# Patient Record
Sex: Male | Born: 1960 | Race: White | Hispanic: No | Marital: Single | Smoking: Never smoker
Health system: Southern US, Community
[De-identification: ages and names within clinical notes are randomized; demographics above are authoritative.]

## PROBLEM LIST (undated history)

## (undated) DIAGNOSIS — K625 Hemorrhage of anus and rectum: Secondary | ICD-10-CM

## (undated) DIAGNOSIS — K623 Rectal prolapse: Secondary | ICD-10-CM

## (undated) DIAGNOSIS — F79 Unspecified intellectual disabilities: Secondary | ICD-10-CM

## (undated) DIAGNOSIS — K649 Unspecified hemorrhoids: Secondary | ICD-10-CM

## (undated) DIAGNOSIS — R011 Cardiac murmur, unspecified: Secondary | ICD-10-CM

## (undated) DIAGNOSIS — H02409 Unspecified ptosis of unspecified eyelid: Secondary | ICD-10-CM

## (undated) DIAGNOSIS — G40909 Epilepsy, unspecified, not intractable, without status epilepticus: Secondary | ICD-10-CM

## (undated) HISTORY — DX: Cardiac murmur, unspecified: R01.1

## (undated) HISTORY — DX: Unspecified intellectual disabilities: F79

## (undated) HISTORY — PX: HERNIA REPAIR: SHX51

## (undated) HISTORY — PX: ORTHOPEDIC SURGERY: SHX850

## (undated) HISTORY — DX: Epilepsy, unspecified, not intractable, without status epilepticus: G40.909

## (undated) HISTORY — DX: Unspecified ptosis of unspecified eyelid: H02.409

## (undated) HISTORY — PX: OTHER SURGICAL HISTORY: SHX169

---

## 1997-09-18 ENCOUNTER — Encounter (HOSPITAL_COMMUNITY): Admission: RE | Admit: 1997-09-18 | Discharge: 1997-10-19 | Payer: Self-pay | Admitting: Family Medicine

## 1997-11-05 ENCOUNTER — Encounter (HOSPITAL_COMMUNITY): Admission: RE | Admit: 1997-11-05 | Discharge: 1998-02-03 | Payer: Self-pay | Admitting: Family Medicine

## 1999-11-24 ENCOUNTER — Inpatient Hospital Stay (HOSPITAL_COMMUNITY): Admission: EM | Admit: 1999-11-24 | Discharge: 1999-11-25 | Payer: Self-pay

## 1999-11-24 ENCOUNTER — Encounter: Payer: Self-pay | Admitting: Orthopedic Surgery

## 1999-12-07 ENCOUNTER — Ambulatory Visit (HOSPITAL_BASED_OUTPATIENT_CLINIC_OR_DEPARTMENT_OTHER): Admission: RE | Admit: 1999-12-07 | Discharge: 1999-12-08 | Payer: Self-pay | Admitting: Orthopedic Surgery

## 1999-12-07 ENCOUNTER — Encounter: Payer: Self-pay | Admitting: Orthopedic Surgery

## 2001-09-30 ENCOUNTER — Emergency Department (HOSPITAL_COMMUNITY): Admission: EM | Admit: 2001-09-30 | Discharge: 2001-09-30 | Payer: Self-pay | Admitting: Emergency Medicine

## 2010-09-30 ENCOUNTER — Encounter: Payer: Self-pay | Admitting: Cardiology

## 2010-10-03 ENCOUNTER — Encounter: Payer: Self-pay | Admitting: Cardiology

## 2010-10-03 ENCOUNTER — Ambulatory Visit (INDEPENDENT_AMBULATORY_CARE_PROVIDER_SITE_OTHER): Payer: Medicare Other | Admitting: Cardiology

## 2010-10-03 VITALS — BP 118/70 | HR 73 | Ht 60.0 in | Wt 108.8 lb

## 2010-10-03 DIAGNOSIS — G40909 Epilepsy, unspecified, not intractable, without status epilepticus: Secondary | ICD-10-CM | POA: Insufficient documentation

## 2010-10-03 DIAGNOSIS — I421 Obstructive hypertrophic cardiomyopathy: Secondary | ICD-10-CM

## 2010-10-03 DIAGNOSIS — I422 Other hypertrophic cardiomyopathy: Secondary | ICD-10-CM

## 2010-10-03 DIAGNOSIS — F79 Unspecified intellectual disabilities: Secondary | ICD-10-CM

## 2010-10-03 NOTE — Progress Notes (Signed)
   Derek Chandler Date of Birth: 09-13-1960   History of Present Illness: Derek Chandler is seen at the request of his primary care physician for evaluation of his murmur and whether or not he needs SBE prophylaxis. He is 50 years old. He was seen here last in 2004. He has a history of hypertrophic obstructive cardiomyopathy based on echocardiogram in 1995. He has never had any symptoms related to his cardiac condition. Previously he had been on routine SBE prophylaxis but has never had an episode of endocarditis. He denies any shortness of breath, palpitations, dizziness, or chest pain. He has never had syncope.  Current Outpatient Prescriptions on File Prior to Visit  Medication Sig Dispense Refill  . aspirin 81 MG tablet Take 81 mg by mouth daily.        . Calcium Carbonate-Vitamin D (CALCIUM 500 + D PO) Take by mouth 2 (two) times daily.        . carbamazepine (TEGRETOL XR) 200 MG 12 hr tablet Take 200 mg by mouth 2 (two) times daily. 2 and 1/2 tablets daily       . docusate sodium (COLACE) 100 MG capsule Take 100 mg by mouth 3 (three) times daily.        . simvastatin (ZOCOR) 10 MG tablet Take 10 mg by mouth at bedtime.          No Known Allergies  Past Medical History  Diagnosis Date  . Heart murmur   . Mental retardation   . Ptosis   . Seizure disorder   . Hypertrophic obstructive cardiomyopathy     Past Surgical History  Procedure Date  . Orthopedic surgery     History  Smoking status  . Never Smoker   Smokeless tobacco  . Not on file    History  Alcohol Use No    History reviewed. No pertinent family history.  Review of Systems: The review of systems is primarily given by his caregiver.  All other systems were reviewed and are negative.  Physical Exam: BP 118/70  Pulse 73  Ht 5' (1.524 m)  Wt 108 lb 12.8 oz (49.351 kg)  BMI 21.25 kg/m2 He is a thin mentally retarded male seen in a chair. Vocal responses are limited. His HEENT exam is remarkable for ptosis.pupils  are equal round and reactive. Oropharynx is clear. Neck is supple without JVD, adenopathy, thyromegaly, or bruits. Lungs are clear. Cardiac exam reveals a grade 2/6 systolic ejection murmur heard best at the left sternal border. There is no diastolic murmur or S3. There is no left. Abdomen is soft and nontender. He has no edema. LABORATORY DATA: ECG demonstrates normal sinus rhythm with an incomplete right bundle branch block. There is right ventricular hypertrophy with strain. There are Q waves in the inferior leads.  Assessment / Plan:

## 2010-10-03 NOTE — Assessment & Plan Note (Signed)
The patient has known hypertrophic obstructive cardiomyopathy. He is asymptomatic. Based on current AHA guidelines he does not require SBE prophylaxis. There is also no indication for any other cardiac medications at this time. We will plan on seeing him back on a p.r.n. basis.

## 2010-10-03 NOTE — Patient Instructions (Signed)
No SBE prohylaxis needed.  We will see back PRN

## 2010-10-04 ENCOUNTER — Telehealth: Payer: Self-pay | Admitting: *Deleted

## 2010-10-04 NOTE — Telephone Encounter (Signed)
Called [facility] center to get fax number-to fax office note: [phone]

## 2010-10-05 ENCOUNTER — Encounter: Payer: Self-pay | Admitting: Cardiology

## 2011-02-26 ENCOUNTER — Encounter (HOSPITAL_COMMUNITY): Payer: Self-pay | Admitting: Emergency Medicine

## 2011-02-26 ENCOUNTER — Emergency Department (HOSPITAL_COMMUNITY)
Admission: EM | Admit: 2011-02-26 | Discharge: 2011-02-27 | Disposition: A | Discharge status: Home / Self Care | Payer: Medicare Other | Attending: Emergency Medicine | Admitting: Emergency Medicine

## 2011-02-26 ENCOUNTER — Emergency Department (HOSPITAL_COMMUNITY): Payer: Medicare Other

## 2011-02-26 DIAGNOSIS — W2209XA Striking against other stationary object, initial encounter: Secondary | ICD-10-CM | POA: Insufficient documentation

## 2011-02-26 DIAGNOSIS — F79 Unspecified intellectual disabilities: Secondary | ICD-10-CM | POA: Insufficient documentation

## 2011-02-26 DIAGNOSIS — M79609 Pain in unspecified limb: Secondary | ICD-10-CM | POA: Insufficient documentation

## 2011-02-26 DIAGNOSIS — S62639A Displaced fracture of distal phalanx of unspecified finger, initial encounter for closed fracture: Secondary | ICD-10-CM | POA: Insufficient documentation

## 2011-02-26 DIAGNOSIS — Z79899 Other long term (current) drug therapy: Secondary | ICD-10-CM | POA: Insufficient documentation

## 2011-02-26 DIAGNOSIS — G40909 Epilepsy, unspecified, not intractable, without status epilepticus: Secondary | ICD-10-CM | POA: Insufficient documentation

## 2011-02-26 NOTE — ED Notes (Signed)
Pt presents with caregiver from [facility], pt MRDD, recently went on home visit, presents with band aid on right middle finger, unknown cause, dsd in place, pms intact

## 2011-02-27 MED ORDER — CEPHALEXIN 500 MG PO CAPS: 500.0000 mg | ORAL_CAPSULE | Freq: Four times a day (QID) | ORAL | Status: AC

## 2011-02-27 MED ORDER — HYDROCODONE-ACETAMINOPHEN 5-500 MG PO TABS: 2.0000 | ORAL_TABLET | Freq: Four times a day (QID) | ORAL | Status: AC | PRN

## 2011-02-27 MED ORDER — CEPHALEXIN 500 MG PO CAPS
ORAL_CAPSULE | ORAL | Status: AC
  Filled 2011-02-27: qty 1

## 2011-02-27 MED ORDER — CEPHALEXIN 500 MG PO CAPS
500.0000 mg | ORAL_CAPSULE | Freq: Once | ORAL | Status: AC
  Administered 2011-02-27: 500 mg via ORAL

## 2011-02-27 NOTE — ED Provider Notes (Signed)
History    patient with history of severe mental retardation is present to the ED with his caregiver. Protective, the patient went home with his brother today when apparently he jammed his right middle finger in a door. He returns to his living facility with a Band-Aids to right middle finger and some mild bleeding. The patient does not complain of any significant discomfort. However patient unable to further verbalize due to his mental retardation.    CSN: [id number] Arrival date & time: 02/26/2011 11:04 PM   First MD Initiated Contact with Patient 02/27/11 0200      Chief Complaint  Patient presents with  . Finger Injury    Right Middle Nail    (Consider location/radiation/quality/duration/timing/severity/associated sxs/prior treatment) Patient is a 50 y.o. male presenting with hand pain. The history is provided by the nursing home and a caregiver. The history is limited by a developmental delay.  Hand Pain This is a new problem. The current episode started today. The problem occurs constantly. Pertinent negatives include no numbness. The symptoms are aggravated by bending.    Past Medical History  Diagnosis Date  . Heart murmur   . Mental retardation   . Ptosis   . Seizure disorder   . Hypertrophic obstructive cardiomyopathy     Past Surgical History  Procedure Date  . Orthopedic surgery     History reviewed. No pertinent family history.  History  Substance Use Topics  . Smoking status: Never Smoker   . Smokeless tobacco: Not on file  . Alcohol Use: No      Review of Systems  Neurological: Negative for numbness.  All other systems reviewed and are negative.    Allergies  Review of patient's allergies indicates no known allergies.  Home Medications   Current Outpatient Rx  Name Route Sig Dispense Refill  . ASPIRIN 81 MG PO TABS Oral Take 81 mg by mouth daily.      Marland Kitchen CALCIUM 500 + D PO Oral Take by mouth 2 (two) times daily.      Marland Kitchen CARBAMAZEPINE 200 MG PO  TB12 Oral Take 200 mg by mouth 2 (two) times daily. 2 and 1/2 tablets daily     . DOCUSATE SODIUM 100 MG PO CAPS Oral Take 100 mg by mouth 3 (three) times daily.      Marland Kitchen SIMVASTATIN 10 MG PO TABS Oral Take 10 mg by mouth at bedtime.      Marland Kitchen CARBAMAZEPINE 400 MG PO TB12 Oral Take 400 mg by mouth 2 (two) times daily.        BP 132/75  Pulse 86  Temp(Src) 98 F (36.7 C) (Oral)  Resp 16  SpO2 96%  Physical Exam  Constitutional: He appears well-developed and well-nourished.  Musculoskeletal:       R hand: R middle finger with blood to fingernail, but no open wound.  Tenderness to palpation.  Unable to flex due to pain.    Neurological: He is alert.  Skin: Skin is warm and dry.    ED Course  Procedures (including critical care time)  Labs Reviewed - No data to display Dg Finger Middle Right  02/26/2011  *RADIOLOGY REPORT*  Clinical Data: Injury  RIGHT MIDDLE FINGER 2+V  Comparison: None.  Findings: There is a mildly displaced fracture involving the proximal metaphysis of the distal phalanx of the long finger. There is associated soft tissue swelling.  Bones are mildly demineralized.  IMPRESSION: Acute fracture involving the distal phalanx of the long finger.  Original  Report Authenticated By: Donavan Burnet, M.D.     No diagnosis found.    MDM  X-ray of right middle finger basic is significant for a mildly displaced fracture involving the proximal metaphysis of the distal phalanx.  On exam, the finger is significant for trauma to nail bed with mild bleeding.  No obvious laceration or bone exposure Decreased range of motion due to pain. Normal wrists evaluation.  Finger will be seen with appropriate wound care. A finger splint will be placed.  Antibiotic will be given here and at discharge. I would give him referral to hand specialist.        Fayrene Helper, PA Resident 02/27/11 [phone]

## 2011-02-27 NOTE — ED Provider Notes (Signed)
Medical screening examination/treatment/procedure(s) were performed by non-physician practitioner and as supervising physician I was immediately available for consultation/collaboration.   Vida Roller, MD 02/27/11 2037

## 2012-09-11 ENCOUNTER — Emergency Department (HOSPITAL_COMMUNITY): Payer: Medicare Other

## 2012-09-11 ENCOUNTER — Encounter (HOSPITAL_COMMUNITY): Payer: Self-pay | Admitting: Emergency Medicine

## 2012-09-11 ENCOUNTER — Emergency Department (HOSPITAL_COMMUNITY)
Admission: EM | Admit: 2012-09-11 | Discharge: 2012-09-11 | Disposition: A | Discharge status: Home / Self Care | Payer: Medicare Other | Source: Home / Self Care | Attending: Emergency Medicine | Admitting: Emergency Medicine

## 2012-09-11 DIAGNOSIS — R569 Unspecified convulsions: Secondary | ICD-10-CM

## 2012-09-11 DIAGNOSIS — H02409 Unspecified ptosis of unspecified eyelid: Secondary | ICD-10-CM | POA: Insufficient documentation

## 2012-09-11 DIAGNOSIS — IMO0002 Reserved for concepts with insufficient information to code with codable children: Secondary | ICD-10-CM | POA: Insufficient documentation

## 2012-09-11 DIAGNOSIS — Y9389 Activity, other specified: Secondary | ICD-10-CM | POA: Insufficient documentation

## 2012-09-11 DIAGNOSIS — Z8679 Personal history of other diseases of the circulatory system: Secondary | ICD-10-CM | POA: Insufficient documentation

## 2012-09-11 DIAGNOSIS — Z7982 Long term (current) use of aspirin: Secondary | ICD-10-CM | POA: Insufficient documentation

## 2012-09-11 DIAGNOSIS — Z79899 Other long term (current) drug therapy: Secondary | ICD-10-CM | POA: Insufficient documentation

## 2012-09-11 DIAGNOSIS — R4182 Altered mental status, unspecified: Secondary | ICD-10-CM | POA: Insufficient documentation

## 2012-09-11 DIAGNOSIS — Y9210 Unspecified residential institution as the place of occurrence of the external cause: Secondary | ICD-10-CM | POA: Insufficient documentation

## 2012-09-11 DIAGNOSIS — W010XXA Fall on same level from slipping, tripping and stumbling without subsequent striking against object, initial encounter: Secondary | ICD-10-CM | POA: Insufficient documentation

## 2012-09-11 DIAGNOSIS — M6281 Muscle weakness (generalized): Secondary | ICD-10-CM | POA: Insufficient documentation

## 2012-09-11 DIAGNOSIS — F79 Unspecified intellectual disabilities: Secondary | ICD-10-CM | POA: Insufficient documentation

## 2012-09-11 DIAGNOSIS — G40909 Epilepsy, unspecified, not intractable, without status epilepticus: Secondary | ICD-10-CM | POA: Insufficient documentation

## 2012-09-11 LAB — URINALYSIS, ROUTINE W REFLEX MICROSCOPIC
Leukocytes, UA: NEGATIVE
Nitrite: NEGATIVE
Protein, ur: NEGATIVE mg/dL
Urobilinogen, UA: 0.2 mg/dL (ref 0.0–1.0)

## 2012-09-11 LAB — CBC WITH DIFFERENTIAL/PLATELET
Basophils Relative: 0 % (ref 0–1)
HCT: 42.4 % (ref 39.0–52.0)
Hemoglobin: 14.8 g/dL (ref 13.0–17.0)
MCH: 31.2 pg (ref 26.0–34.0)
MCHC: 34.9 g/dL (ref 30.0–36.0)
Monocytes Absolute: 1.9 10*3/uL — ABNORMAL HIGH (ref 0.1–1.0)
Monocytes Relative: 15 % — ABNORMAL HIGH (ref 3–12)
Neutro Abs: 9.7 10*3/uL — ABNORMAL HIGH (ref 1.7–7.7)

## 2012-09-11 LAB — COMPREHENSIVE METABOLIC PANEL
Albumin: 4.3 g/dL (ref 3.5–5.2)
BUN: 17 mg/dL (ref 6–23)
Chloride: 103 mEq/L (ref 96–112)
Creatinine, Ser: 0.51 mg/dL (ref 0.50–1.35)
GFR calc Af Amer: >90 mL/min (ref >90)
GFR calc non Af Amer: >90 mL/min (ref >90)
Glucose, Bld: 94 mg/dL (ref 70–99)
Total Bilirubin: 0.3 mg/dL (ref 0.3–1.2)

## 2012-09-11 LAB — CARBAMAZEPINE LEVEL, TOTAL: Carbamazepine Lvl: 13.8 ug/mL — ABNORMAL HIGH (ref 4.0–12.0)

## 2012-09-11 MED ORDER — LORAZEPAM 2 MG/ML IJ SOLN
0.5000 mg | Freq: Once | INTRAMUSCULAR | Status: AC
  Administered 2012-09-11: 0.5 mg via INTRAVENOUS
  Filled 2012-09-11: qty 1

## 2012-09-11 MED ORDER — SODIUM CHLORIDE 0.9 % IV BOLUS (SEPSIS)
1000.0000 mL | Freq: Once | INTRAVENOUS | Status: AC
  Administered 2012-09-11: 1000 mL via INTRAVENOUS

## 2012-09-11 NOTE — ED Notes (Signed)
Pt caregiver reports pt behavior was not normal this morning.  Caregiver reports restlessness and  left arm weakness.  Pt is developmentally delayed.  Pt is unable to communicate at this time.  Pt is alert.

## 2012-09-11 NOTE — ED Provider Notes (Addendum)
History     CSN: [id number]  Arrival date & time 09/11/12  1216   First MD Initiated Contact with Patient 09/11/12 1224      Chief Complaint  Patient presents with  . Head Injury    (Consider location/radiation/quality/duration/timing/severity/associated sxs/prior treatment) HPI Comments: No witnessed sz activity today  Patient is a 52 y.o. male presenting with head injury. The history is provided by medical records (transporter from the home where he lives).  Head Injury Location:  L parietal Time since incident: unknown. Mechanism of injury comment:  Pt not acting himself today and initially refusing to use his left arm and leg but then started using it.  More fidgety then normal. when facility physician examined pt noted abrasion to the left parietal. Chronicity:  New Relieved by:  Nothing Worsened by:  Nothing tried Ineffective treatments:  None tried Associated symptoms: focal weakness   Associated symptoms: no nausea and no vomiting   Associated symptoms comment:  States initially was not moving left side and then just refused to walk but moving the left side.  Unknown if pt fell.  Has not been himself today.  No recent med changes Risk factors comment:  MR, sz disorder.   Past Medical History  Diagnosis Date  . Heart murmur   . Mental retardation   . Ptosis   . Seizure disorder   . Hypertrophic obstructive cardiomyopathy     Past Surgical History  Procedure Laterality Date  . Orthopedic surgery      History reviewed. No pertinent family history.  History  Substance Use Topics  . Smoking status: Never Smoker   . Smokeless tobacco: Not on file  . Alcohol Use: No      Review of Systems  Constitutional: Negative for fever.  Respiratory: Negative for cough and shortness of breath.   Gastrointestinal: Negative for nausea, vomiting and abdominal pain.  Neurological: Positive for focal weakness.  All other systems reviewed and are  negative.    Allergies  Review of patient's allergies indicates no known allergies.  Home Medications   Current Outpatient Rx  Name  Route  Sig  Dispense  Refill  . aspirin 81 MG tablet   Oral   Take 81 mg by mouth daily.           . Calcium Carbonate-Vitamin D (CALCIUM 500 + D PO)   Oral   Take by mouth 2 (two) times daily.           . carbamazepine (TEGRETOL XR) 200 MG 12 hr tablet   Oral   Take 200 mg by mouth 2 (two) times daily. 2 and 1/2 tablets daily          . carbamazepine (TEGRETOL XR) 400 MG 12 hr tablet   Oral   Take 400 mg by mouth 2 (two) times daily.           Marland Kitchen docusate sodium (COLACE) 100 MG capsule   Oral   Take 100 mg by mouth 3 (three) times daily.           . simvastatin (ZOCOR) 10 MG tablet   Oral   Take 10 mg by mouth at bedtime.             BP 125/61  Pulse 95  Temp(Src) 97.7 F (36.5 C) (Axillary)  Resp 20  SpO2 97%  Physical Exam  Nursing note and vitals reviewed. Constitutional: He appears well-developed and well-nourished. No distress.  Walk and looking around  HENT:  Head: Normocephalic. Head is with abrasion.    Mouth/Throat: Oropharynx is clear and moist.  Eyes: Conjunctivae and EOM are normal. Pupils are equal, round, and reactive to light.  strabismus  Neck: Normal range of motion. Neck supple.  Cardiovascular: Normal rate, regular rhythm and intact distal pulses.   Murmur heard. Pulmonary/Chest: Effort normal and breath sounds normal. No respiratory distress. He has no wheezes. He has no rales.  Abdominal: Soft. He exhibits no distension. There is no tenderness. There is no rebound and no guarding.  Musculoskeletal: Normal range of motion. He exhibits no edema and no tenderness.       Right hip: Normal.       Left hip: Normal.  Neurological: He is alert. He has normal strength. No sensory deficit.  Moving all ext.  Skin: Skin is warm and dry. No rash noted. No erythema.  Psychiatric: He has a normal mood  and affect. His behavior is normal.    ED Course  Procedures (including critical care time)  Labs Reviewed  CBC WITH DIFFERENTIAL - Abnormal; Notable for the following:    WBC 12.9 (*)    Neutro Abs 9.7 (*)    Lymphocytes Relative 10 (*)    Monocytes Relative 15 (*)    Monocytes Absolute 1.9 (*)    All other components within normal limits  URINALYSIS, ROUTINE W REFLEX MICROSCOPIC - Abnormal; Notable for the following:    Ketones, ur >80 (*)    All other components within normal limits  COMPREHENSIVE METABOLIC PANEL  CARBAMAZEPINE LEVEL, TOTAL   Ct Head Wo Contrast  09/11/2012   *RADIOLOGY REPORT*  Clinical Data: Change in mental status.  CT HEAD WITHOUT CONTRAST  Technique:  Contiguous axial images were obtained from the base of the skull through the vertex without contrast.  Comparison: None.  Findings: There is slight dilatation of the lateral ventricles.  There is a focal area of encephalomalacia of the posterior aspect  of the right temporal lobe, probably an old infarct. Prominent  sylvian fissure on the right.  Small focal area of encephalomalacia in the left frontal lobe  adjacent to the interhemispheric falx.   No acute hemorrhage or infarction. No acute osseous abnormality.  IMPRESSION:  Slightly dilated lateral ventricles which I suspect are chronic  given the absence of periventricular edema.   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   Original Report Authenticated By: Francene Boyers, M.D.   Ct Cervical Spine Wo Contrast  09/11/2012   **ADDENDUM** CREATED: 09/11/2012 13:45:34  CERVICAL SPINE CT: There is no fracture or subluxation or prevertebral soft tissue swelling.  No prevertebral soft tissue swelling.   Impression:  No acute abnormality of the cervical spine.  **END ADDENDUM** SIGNED BY: Allayne Gitelman. Jena Gauss, M.D.  09/11/2012   *RADIOLOGY REPORT*  Clinical Data: Possible fall.  Mental retardation.  CT CERVICAL SPINE WITHOUT CONTRAST   Technique:  Multidetector CT imaging of the cervical spine was performed. Multiplanar CT image reconstructions were also generated.  Comparison: None.  Findings: There is slight dilatation of the lateral ventricles. There is a focal area of encephalomalacia of the posterior aspect of the right temporal lobe, probably an old infarct.  Prominent sylvian fissure on the right.  Small focal area of encephalomalacia in the left frontal lobe adjacent to the interhemispheric falx.  No acute hemorrhage or infarction.  No acute osseous abnormality.  IMPRESSION: Slightly dilated lateral ventricles which I suspect are chronic given the absence  of periventricular edema.  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   Original Report Authenticated By: Francene Boyers, M.D.     1. Seizure       MDM   Patient with MR, seizure disorder and hypertrophic cardiomyopathy who comes in today due to change in mental status. Today per caregiver patient was noted to not be acting his normal self. He is more fidgety and initially was refusing the use his left arm and leg. After a short time patient started using his extremities but refuses to walk. He normally walks with a walker. The patient evaluated by the facility physician noted to have an abrasion to the left side of his head that staff denies any witnessed seizures and states he hasn't had a seizure in years. He does take Tegretol for this. On exam patient is awake and alert but is nonverbal. He appears in no acute distress and has normal vital signs. Concern for a possible seizure as the cause of his symptoms today versus head trauma with new intracranial findings. No symptoms suggestive of a stroke. Also possibility of urinary tract infection or other infectious etiology. Were supratherapeutic carbamazepine level. CBC, CMP, UA, Tegretol level, head CT pending  2:45 PM  Mild leukocytosis of 13,000 and o/w labs wnl.  UA with >80 ketones.   CT of head and neck without acute findings.  Tegretol level pending.  Feel most likely pt had an unwitnessed sz today and not moving his left side for awhile may have been todd's paralysis.    3:15 PM Tegretol just mildly elevated at 13.8 with normal 12.  Will hold evening dose but o/w will d/c home.   Gwyneth Sprout, MD 09/11/12 1452  Gwyneth Sprout, MD 09/11/12 1515  Gwyneth Sprout, MD 09/11/12 [phone]

## 2012-09-12 ENCOUNTER — Inpatient Hospital Stay (HOSPITAL_COMMUNITY)
Admission: EM | Admit: 2012-09-12 | Discharge: 2012-09-17 | DRG: 064 | Disposition: A | Discharge status: Home / Self Care | Payer: Medicare Other | Attending: Internal Medicine | Admitting: Internal Medicine

## 2012-09-12 ENCOUNTER — Emergency Department (HOSPITAL_COMMUNITY): Payer: Medicare Other

## 2012-09-12 ENCOUNTER — Encounter (HOSPITAL_COMMUNITY): Payer: Self-pay | Admitting: Emergency Medicine

## 2012-09-12 DIAGNOSIS — Z79899 Other long term (current) drug therapy: Secondary | ICD-10-CM

## 2012-09-12 DIAGNOSIS — R531 Weakness: Secondary | ICD-10-CM

## 2012-09-12 DIAGNOSIS — R259 Unspecified abnormal involuntary movements: Secondary | ICD-10-CM | POA: Diagnosis present

## 2012-09-12 DIAGNOSIS — I6350 Cerebral infarction due to unspecified occlusion or stenosis of unspecified cerebral artery: Principal | ICD-10-CM | POA: Diagnosis present

## 2012-09-12 DIAGNOSIS — G9340 Encephalopathy, unspecified: Secondary | ICD-10-CM

## 2012-09-12 DIAGNOSIS — N400 Enlarged prostate without lower urinary tract symptoms: Secondary | ICD-10-CM | POA: Diagnosis present

## 2012-09-12 DIAGNOSIS — I639 Cerebral infarction, unspecified: Secondary | ICD-10-CM

## 2012-09-12 DIAGNOSIS — H02409 Unspecified ptosis of unspecified eyelid: Secondary | ICD-10-CM | POA: Diagnosis present

## 2012-09-12 DIAGNOSIS — Z7982 Long term (current) use of aspirin: Secondary | ICD-10-CM

## 2012-09-12 DIAGNOSIS — I421 Obstructive hypertrophic cardiomyopathy: Secondary | ICD-10-CM | POA: Diagnosis present

## 2012-09-12 DIAGNOSIS — R011 Cardiac murmur, unspecified: Secondary | ICD-10-CM | POA: Diagnosis present

## 2012-09-12 DIAGNOSIS — I4891 Unspecified atrial fibrillation: Secondary | ICD-10-CM | POA: Diagnosis present

## 2012-09-12 DIAGNOSIS — R253 Fasciculation: Secondary | ICD-10-CM

## 2012-09-12 DIAGNOSIS — G40909 Epilepsy, unspecified, not intractable, without status epilepticus: Secondary | ICD-10-CM

## 2012-09-12 DIAGNOSIS — F79 Unspecified intellectual disabilities: Secondary | ICD-10-CM

## 2012-09-12 DIAGNOSIS — E785 Hyperlipidemia, unspecified: Secondary | ICD-10-CM | POA: Diagnosis present

## 2012-09-12 DIAGNOSIS — R350 Frequency of micturition: Secondary | ICD-10-CM | POA: Diagnosis present

## 2012-09-12 DIAGNOSIS — H4920 Sixth [abducent] nerve palsy, unspecified eye: Secondary | ICD-10-CM | POA: Diagnosis present

## 2012-09-12 LAB — CBC WITH DIFFERENTIAL/PLATELET
Basophils Absolute: 0 10*3/uL (ref 0.0–0.1)
Basophils Relative: 1 % (ref 0–1)
Eosinophils Absolute: 0 10*3/uL (ref 0.0–0.7)
Eosinophils Relative: 1 % (ref 0–5)
HCT: 40.1 % (ref 39.0–52.0)
Hemoglobin: 13.9 g/dL (ref 13.0–17.0)
Lymphocytes Relative: 17 % (ref 12–46)
Lymphs Abs: 1.5 10*3/uL (ref 0.7–4.0)
MCH: 31.2 pg (ref 26.0–34.0)
MCHC: 34.7 g/dL (ref 30.0–36.0)
MCV: 89.9 fL (ref 78.0–100.0)
Monocytes Absolute: 1.2 10*3/uL — ABNORMAL HIGH (ref 0.1–1.0)
Monocytes Relative: 14 % — ABNORMAL HIGH (ref 3–12)
Neutro Abs: 5.8 10*3/uL (ref 1.7–7.7)
Neutrophils Relative %: 68 % (ref 43–77)
Platelets: 206 10*3/uL (ref 150–400)
RBC: 4.46 MIL/uL (ref 4.22–5.81)
RDW: 13 % (ref 11.5–15.5)
WBC: 8.5 10*3/uL (ref 4.0–10.5)

## 2012-09-12 LAB — CBC
MCH: 31.4 pg (ref 26.0–34.0)
MCHC: 34.7 g/dL (ref 30.0–36.0)
Platelets: 192 10*3/uL (ref 150–400)
RDW: 13.1 % (ref 11.5–15.5)

## 2012-09-12 LAB — BASIC METABOLIC PANEL
BUN: 12 mg/dL (ref 6–23)
CO2: 24 mEq/L (ref 19–32)
Calcium: 9 mg/dL (ref 8.4–10.5)
Chloride: 104 mEq/L (ref 96–112)
Creatinine, Ser: 0.44 mg/dL — ABNORMAL LOW (ref 0.50–1.35)
GFR calc Af Amer: >90 mL/min (ref >90)
GFR calc non Af Amer: >90 mL/min (ref >90)
Glucose, Bld: 87 mg/dL (ref 70–99)
Potassium: 3.9 mEq/L (ref 3.5–5.1)
Sodium: 139 mEq/L (ref 135–145)

## 2012-09-12 LAB — CARBAMAZEPINE LEVEL, TOTAL: Carbamazepine Lvl: 9.5 ug/mL (ref 4.0–12.0)

## 2012-09-12 LAB — URINALYSIS, ROUTINE W REFLEX MICROSCOPIC
Ketones, ur: >80 mg/dL — AB
Nitrite: NEGATIVE
Urobilinogen, UA: 0.2 mg/dL (ref 0.0–1.0)

## 2012-09-12 LAB — CREATININE, SERUM: Creatinine, Ser: 0.46 mg/dL — ABNORMAL LOW (ref 0.50–1.35)

## 2012-09-12 MED ORDER — SODIUM CHLORIDE 0.9 % IV SOLN: 250.0000 mL | INTRAVENOUS | Status: DC | PRN

## 2012-09-12 MED ORDER — SODIUM CHLORIDE 0.9 % IJ SOLN: 3.0000 mL | INTRAMUSCULAR | Status: DC | PRN

## 2012-09-12 MED ORDER — CARBAMAZEPINE 200 MG PO TABS
400.0000 mg | ORAL_TABLET | ORAL | Status: DC
  Administered 2012-09-13 – 2012-09-17 (×10): 400 mg via ORAL
  Filled 2012-09-12 (×13): qty 2

## 2012-09-12 MED ORDER — FINASTERIDE 5 MG PO TABS
5.0000 mg | ORAL_TABLET | Freq: Every day | ORAL | Status: DC
  Administered 2012-09-13 – 2012-09-17 (×5): 5 mg via ORAL
  Filled 2012-09-12 (×5): qty 1

## 2012-09-12 MED ORDER — ASPIRIN EC 81 MG PO TBEC
81.0000 mg | DELAYED_RELEASE_TABLET | Freq: Every day | ORAL | Status: DC
  Administered 2012-09-12 – 2012-09-15 (×4): 81 mg via ORAL
  Filled 2012-09-12 (×4): qty 1

## 2012-09-12 MED ORDER — LORAZEPAM 2 MG/ML IJ SOLN
2.0000 mg | Freq: Once | INTRAMUSCULAR | Status: AC
  Administered 2012-09-12: 2 mg via INTRAMUSCULAR
  Filled 2012-09-12: qty 1

## 2012-09-12 MED ORDER — SODIUM CHLORIDE 0.9 % IV SOLN
INTRAVENOUS | Status: DC
  Administered 2012-09-12: 21:00:00 via INTRAVENOUS

## 2012-09-12 MED ORDER — CARBAMAZEPINE 200 MG PO TABS
500.0000 mg | ORAL_TABLET | Freq: Every day | ORAL | Status: DC
  Administered 2012-09-12 – 2012-09-16 (×5): 500 mg via ORAL
  Filled 2012-09-12 (×7): qty 2.5

## 2012-09-12 MED ORDER — TAMSULOSIN HCL 0.4 MG PO CAPS
0.4000 mg | ORAL_CAPSULE | Freq: Every day | ORAL | Status: DC
  Administered 2012-09-13 – 2012-09-17 (×5): 0.4 mg via ORAL
  Filled 2012-09-12 (×5): qty 1

## 2012-09-12 MED ORDER — ASPIRIN 81 MG PO TABS: 81.0000 mg | ORAL_TABLET | Freq: Every day | ORAL | Status: DC

## 2012-09-12 MED ORDER — SIMVASTATIN 10 MG PO TABS
10.0000 mg | ORAL_TABLET | Freq: Every day | ORAL | Status: DC
  Administered 2012-09-12 – 2012-09-16 (×5): 10 mg via ORAL
  Filled 2012-09-12 (×6): qty 1

## 2012-09-12 MED ORDER — ENOXAPARIN SODIUM 40 MG/0.4ML ~~LOC~~ SOLN
40.0000 mg | SUBCUTANEOUS | Status: DC
  Administered 2012-09-12 – 2012-09-16 (×5): 40 mg via SUBCUTANEOUS
  Filled 2012-09-12 (×6): qty 0.4

## 2012-09-12 MED ORDER — SODIUM CHLORIDE 0.9 % IJ SOLN
3.0000 mL | Freq: Two times a day (BID) | INTRAMUSCULAR | Status: DC
  Administered 2012-09-13 – 2012-09-16 (×7): 3 mL via INTRAVENOUS

## 2012-09-12 MED ORDER — CARBAMAZEPINE 200 MG PO TABS: 400.0000 mg | ORAL_TABLET | ORAL | Status: DC

## 2012-09-12 MED ORDER — POLYETHYLENE GLYCOL 3350 17 G PO PACK
17.0000 g | PACK | Freq: Every day | ORAL | Status: DC
  Administered 2012-09-13 – 2012-09-17 (×5): 17 g via ORAL
  Filled 2012-09-12 (×5): qty 1

## 2012-09-12 MED ORDER — CARBAMAZEPINE 200 MG PO TABS
200.0000 mg | ORAL_TABLET | Freq: Once | ORAL | Status: AC
  Administered 2012-09-12: 200 mg via ORAL
  Filled 2012-09-12: qty 1

## 2012-09-12 NOTE — ED Notes (Signed)
Per pt re[port from facility, "he is not acting like himself, he is hollering out and figiting" Pt is usually ambulatory with a walker per report. Pt was seen here yesterday and staff from facility states that he is no better.

## 2012-09-12 NOTE — ED Notes (Signed)
[facility] here to transport pt to [facility] ?

## 2012-09-12 NOTE — Consult Note (Signed)
NEURO HOSPITALIST CONSULT NOTE    Reason for Consult: abnormal movements, AMS.  HPI:                                                                                                                                          Derek Chandler is an 52 y.o. male right handed with a past medical history significant for mental retardation, seizures, hypertrophic obstructive cardiomyopathy, transferred to [facility] for further evaluation of declining mental status and abnormal movements. Patient is unable to communicate but his father said that [name] takes tegretol and hasn't had a seizure in more than 20 years. He was evaluated at [facility] ED last night because he was not moving the left side, was less responsive, and had developed abnormal movements involving mainly the lower limbs. CT brain was unremarkable for acute abnormalities and tegretol level was slightly elevated. His symptoms did not improve and was taken back to [facility] ED today. According to his father, the movements " jerkiness" started just few days ago and he can not pinpoint to a rerqson for this new development. No new medications or medical illnesses.   Past Medical History  Diagnosis Date  . Heart murmur   . Mental retardation   . Ptosis   . Seizure disorder   . Hypertrophic obstructive cardiomyopathy     Past Surgical History  Procedure Laterality Date  . Orthopedic surgery    . Right leg surgery with plates and wires    . Hernia repair      History reviewed. No pertinent family history.  Family History:   Social History:  reports that he has never smoked. He has never used smokeless tobacco. He reports that he does not drink alcohol. His drug history is not on file.  Allergies  Allergen Reactions  . Pollen Extract     MEDICATIONS:                                                                                                                     I have reviewed the patient's current  medications.   ROS:   Unable to obtain from the patient due to his mental status.  History obtained from chart review and father.   Physical exam: pleasant male in no apparent distress. Head: normocephalic. Neck: supple, no bruits, no JVD. Cardiac: no murmurs. Lungs: clear. Abdomen: soft, no tender, no mass. Extremities: no edema.  Blood pressure 106/54, pulse 99, temperature 98.6 F (37 C), temperature source Oral, resp. rate 22, weight 48.535 kg (107 lb), SpO2 100.00%.   Neurologic Examination:                                                                                                      Mental Status: Awake but  Unable to communicate. Follows some simple commands. Cranial Nerves: Will defer, as he is very restless and doesn't allow me to examine eyes. Motor: Seems to move all limbs spontaneously and symmetrically.    Sensory:  reacts to noxious stimuli. Deep Tendon Reflexes: hyperreflexia throughout Plantars: Right: downgoing   Left: downgoing Cerebellar: Unable to test. Gait:  No tested. CV: pulses palpable throughout     No results found for this basename: cbc, bmp, coags, chol, tri, ldl, hga1c    Results for orders placed during the hospital encounter of 09/12/12 (from the past 48 hour(s))  BASIC METABOLIC PANEL     Status: Abnormal   Collection Time    09/12/12  1:50 PM      Result Value Range   Sodium 139  135 - 145 mEq/L   Potassium 3.9  3.5 - 5.1 mEq/L   Chloride 104  96 - 112 mEq/L   CO2 24  19 - 32 mEq/L   Glucose, Bld 87  70 - 99 mg/dL   BUN 12  6 - 23 mg/dL   Creatinine, Ser 1.61 (*) 0.50 - 1.35 mg/dL   Calcium 9.0  8.4 - 09.6 mg/dL   GFR calc non Af Amer >90  >90 mL/min   GFR calc Af Amer >90  >90 mL/min   Comment:            The eGFR has been calculated     using the CKD EPI equation.     This calculation has not been     validated in all clinical     situations.      eGFR's persistently     <90 mL/min signify     possible Chronic Kidney Disease.  CBC WITH DIFFERENTIAL     Status: Abnormal   Collection Time    09/12/12  1:50 PM      Result Value Range   WBC 8.5  4.0 - 10.5 K/uL   RBC 4.46  4.22 - 5.81 MIL/uL   Hemoglobin 13.9  13.0 - 17.0 g/dL   HCT 04.5  40.9 - 81.1 %   MCV 89.9  78.0 - 100.0 fL   MCH 31.2  26.0 - 34.0 pg   MCHC 34.7  30.0 - 36.0 g/dL   RDW 91.4  78.2 - 95.6 %   Platelets 206  150 - 400 K/uL   Neutrophils Relative % 68  43 - 77 %   Neutro Abs 5.8  1.7 - 7.7 K/uL  Lymphocytes Relative 17  12 - 46 %   Lymphs Abs 1.5  0.7 - 4.0 K/uL   Monocytes Relative 14 (*) 3 - 12 %   Monocytes Absolute 1.2 (*) 0.1 - 1.0 K/uL   Eosinophils Relative 1  0 - 5 %   Eosinophils Absolute 0.0  0.0 - 0.7 K/uL   Basophils Relative 1  0 - 1 %   Basophils Absolute 0.0  0.0 - 0.1 K/uL  CARBAMAZEPINE LEVEL, TOTAL     Status: None   Collection Time    09/12/12  2:40 PM      Result Value Range   Carbamazepine Lvl 9.5  4.0 - 12.0 ug/mL  URINALYSIS, ROUTINE W REFLEX MICROSCOPIC     Status: Abnormal   Collection Time    09/12/12  5:00 PM      Result Value Range   Color, Urine AMBER (*) YELLOW   Comment: BIOCHEMICALS MAY BE AFFECTED BY COLOR   APPearance CLEAR  CLEAR   Specific Gravity, Urine 1.031 (*) 1.005 - 1.030   pH 5.0  5.0 - 8.0   Glucose, UA NEGATIVE  NEGATIVE mg/dL   Hgb urine dipstick NEGATIVE  NEGATIVE   Bilirubin Urine SMALL (*) NEGATIVE   Ketones, ur >80 (*) NEGATIVE mg/dL   Protein, ur NEGATIVE  NEGATIVE mg/dL   Urobilinogen, UA 0.2  0.0 - 1.0 mg/dL   Nitrite NEGATIVE  NEGATIVE   Leukocytes, UA NEGATIVE  NEGATIVE   Comment: MICROSCOPIC NOT DONE ON URINES WITH NEGATIVE PROTEIN, BLOOD, LEUKOCYTES, NITRITE, OR GLUCOSE <1000 mg/dL.  CBC     Status: None   Collection Time    09/12/12  6:55 PM      Result Value Range   WBC 9.1  4.0 - 10.5 K/uL   RBC 4.36  4.22 - 5.81 MIL/uL   Hemoglobin 13.7  13.0 - 17.0 g/dL   HCT 95.6  21.3 -  08.6 %   MCV 90.6  78.0 - 100.0 fL   MCH 31.4  26.0 - 34.0 pg   MCHC 34.7  30.0 - 36.0 g/dL   RDW 57.8  46.9 - 62.9 %   Platelets 192  150 - 400 K/uL    Ct Head Wo Contrast  09/12/2012   *RADIOLOGY REPORT*  Clinical Data: Altered mental status.  CT HEAD WITHOUT CONTRAST  Technique:  Contiguous axial images were obtained from the base of the skull through the vertex without contrast.  Comparison: 09/11/2012  Findings: Ventricular dilatation is unchanged from previous exam. Focal area of encephalomalacia involving the posterior aspect of the right temporal lobe is unchanged from previous exam.  Stable focal area of encephalomalacia within the left frontal lobe, image 12/series 2.  There is no evidence for acute brain infarct, hemorrhage or mass.  The paranasal sinuses and mastoid air cells are clear.  The skull is intact.  IMPRESSION:  1.  Stable examination. 2.  No change in volume of the lateral ventricles. 3.  Left frontal lobe and right posterior temporal lobe encephalomalacia is unchanged.   Original Report Authenticated By: Signa Kell, M.D.   Ct Head Wo Contrast  09/11/2012   *RADIOLOGY REPORT*  Clinical Data: Change in mental status.  CT HEAD WITHOUT CONTRAST  Technique:  Contiguous axial images were obtained from the base of the skull through the vertex without contrast.  Comparison: None.  Findings: There is slight dilatation of the lateral ventricles.  There is a focal area of encephalomalacia of the posterior aspect  of  the right temporal lobe, probably an old infarct. Prominent  sylvian fissure on the right.  Small focal area of encephalomalacia in the left frontal lobe  adjacent to the interhemispheric falx.   No acute hemorrhage or infarction. No acute osseous abnormality.  IMPRESSION:  Slightly dilated lateral ventricles which I suspect are chronic  given the absence of periventricular edema.   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   Original Report  Authenticated By: Francene Boyers, M.D.   Ct Cervical Spine Wo Contrast  09/11/2012   **ADDENDUM** CREATED: 09/11/2012 13:45:34  CERVICAL SPINE CT: There is no fracture or subluxation or prevertebral soft tissue swelling.  No prevertebral soft tissue swelling.   Impression:  No acute abnormality of the cervical spine.  **END ADDENDUM** SIGNED BY: Allayne Gitelman. Jena Gauss, M.D.  09/11/2012   *RADIOLOGY REPORT*  Clinical Data: Possible fall.  Mental retardation.  CT CERVICAL SPINE WITHOUT CONTRAST  Technique:  Multidetector CT imaging of the cervical spine was performed. Multiplanar CT image reconstructions were also generated.  Comparison: None.  Findings: There is slight dilatation of the lateral ventricles. There is a focal area of encephalomalacia of the posterior aspect of the right temporal lobe, probably an old infarct.  Prominent sylvian fissure on the right.  Small focal area of encephalomalacia in the left frontal lobe adjacent to the interhemispheric falx.  No acute hemorrhage or infarction.  No acute osseous abnormality.  IMPRESSION: Slightly dilated lateral ventricles which I suspect are chronic given the absence of periventricular edema.  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   Original Report Authenticated By: Francene Boyers, M.D.     Assessment/Plan: 52 years old with MR, symptomatic seizures ( seizure free on tegretol for many years), admitted with recent changes in MS and abnormal movements involving mainly lower extremities. Neuro-exam is very limited due to patient's restlessness and his inability to follow commands. Will start work up with MRI brain and EEG. Will follow up.  Wyatt Portela, MD [facility] [phone]  09/12/2012, 7:31 PM

## 2012-09-12 NOTE — ED Notes (Signed)
Report to Delice Bison at [facility]. Pt to be transferred.

## 2012-09-12 NOTE — ED Provider Notes (Signed)
History     CSN: [id number]  Arrival date & time 09/12/12  1233   First MD Initiated Contact with Patient 09/12/12 1304      Chief Complaint  Patient presents with  . Re-evaluation    (Consider location/radiation/quality/duration/timing/severity/associated sxs/prior treatment) HPI Patient presents to the emergency department with continued change in normal status and involuntary movements of his extremities.  Patient was seen in the emergency department for weakness yesterday.  Patient was sent home following a full evaluation.  Patient was seen by the doctor for the facility and felt that he needed further neurological evaluation.  The history is obtained from a caregiver from the facility, who is with the patient. Past Medical History  Diagnosis Date  . Heart murmur   . Mental retardation   . Ptosis   . Seizure disorder   . Hypertrophic obstructive cardiomyopathy     Past Surgical History  Procedure Laterality Date  . Orthopedic surgery      No family history on file.  History  Substance Use Topics  . Smoking status: Never Smoker   . Smokeless tobacco: Not on file  . Alcohol Use: No      Review of Systems Level V caveat applies due to mental retardation Allergies  Pollen extract  Home Medications   Current Outpatient Rx  Name  Route  Sig  Dispense  Refill  . aspirin 81 MG tablet   Oral   Take 81 mg by mouth daily.           . Calcium Carbonate-Vitamin D (CALCIUM 500 + D PO)   Oral   Take by mouth 2 (two) times daily.           . carbamazepine (TEGRETOL) 200 MG tablet   Oral   Take 400-500 mg by mouth See admin instructions. Pt takes 200 mg twice daily and at 8 PM  pt takes  500 mg         . finasteride (PROSCAR) 5 MG tablet   Oral   Take 5 mg by mouth daily.         . polyethylene glycol (MIRALAX / GLYCOLAX) packet   Oral   Take 17 g by mouth daily.         . simvastatin (ZOCOR) 10 MG tablet   Oral   Take 10 mg by mouth at bedtime.            . tamsulosin (FLOMAX) 0.4 MG CAPS   Oral   Take 0.4 mg by mouth daily.           BP 120/76  Pulse 91  Temp(Src) 97.5 F (36.4 C) (Axillary)  Resp 18  Wt 107 lb (48.535 kg)  BMI 20.9 kg/m2  SpO2 100%  Physical Exam  Nursing note and vitals reviewed. Constitutional: He appears well-developed and well-nourished.  HENT:  Head: Normocephalic and atraumatic.  Mouth/Throat: Oropharynx is clear and moist.  Eyes: Pupils are equal, round, and reactive to light.  Neck: Neck supple.  Cardiovascular: Normal rate, regular rhythm and intact distal pulses.  Exam reveals no friction rub.   Murmur heard. Pulmonary/Chest: Effort normal and breath sounds normal. No respiratory distress. He has no wheezes. He has no rales.  Neurological: He is alert.  Skin: Skin is warm and dry.    ED Course  Procedures (including critical care time)  Labs Reviewed  BASIC METABOLIC PANEL - Abnormal; Notable for the following:    Creatinine, Ser 0.44 (*)  All other components within normal limits  CBC WITH DIFFERENTIAL - Abnormal; Notable for the following:    Monocytes Relative 14 (*)    Monocytes Absolute 1.2 (*)    All other components within normal limits  CARBAMAZEPINE LEVEL, TOTAL   Ct Head Wo Contrast  09/12/2012   *RADIOLOGY REPORT*  Clinical Data: Altered mental status.  CT HEAD WITHOUT CONTRAST  Technique:  Contiguous axial images were obtained from the base of the skull through the vertex without contrast.  Comparison: 09/11/2012  Findings: Ventricular dilatation is unchanged from previous exam. Focal area of encephalomalacia involving the posterior aspect of the right temporal lobe is unchanged from previous exam.  Stable focal area of encephalomalacia within the left frontal lobe, image 12/series 2.  There is no evidence for acute brain infarct, hemorrhage or mass.  The paranasal sinuses and mastoid air cells are clear.  The skull is intact.  IMPRESSION:  1.  Stable examination. 2.   No change in volume of the lateral ventricles. 3.  Left frontal lobe and right posterior temporal lobe encephalomalacia is unchanged.   Original Report Authenticated By: Signa Kell, M.D.   Ct Head Wo Contrast  09/11/2012   *RADIOLOGY REPORT*  Clinical Data: Change in mental status.  CT HEAD WITHOUT CONTRAST  Technique:  Contiguous axial images were obtained from the base of the skull through the vertex without contrast.  Comparison: None.  Findings: There is slight dilatation of the lateral ventricles.  There is a focal area of encephalomalacia of the posterior aspect  of the right temporal lobe, probably an old infarct. Prominent  sylvian fissure on the right.  Small focal area of encephalomalacia in the left frontal lobe  adjacent to the interhemispheric falx.   No acute hemorrhage or infarction. No acute osseous abnormality.  IMPRESSION:  Slightly dilated lateral ventricles which I suspect are chronic  given the absence of periventricular edema.   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   Original Report Authenticated By: Francene Boyers, M.D.   Ct Cervical Spine Wo Contrast  09/11/2012   **ADDENDUM** CREATED: 09/11/2012 13:45:34  CERVICAL SPINE CT: There is no fracture or subluxation or prevertebral soft tissue swelling.  No prevertebral soft tissue swelling.   Impression:  No acute abnormality of the cervical spine.  **END ADDENDUM** SIGNED BY: Allayne Gitelman. Jena Gauss, M.D.  09/11/2012   *RADIOLOGY REPORT*  Clinical Data: Possible fall.  Mental retardation.  CT CERVICAL SPINE WITHOUT CONTRAST  Technique:  Multidetector CT imaging of the cervical spine was performed. Multiplanar CT image reconstructions were also generated.  Comparison: None.  Findings: There is slight dilatation of the lateral ventricles. There is a focal area of encephalomalacia of the posterior aspect of the right temporal lobe, probably an old infarct.  Prominent sylvian fissure on the right.  Small  focal area of encephalomalacia in the left frontal lobe adjacent to the interhemispheric falx.  No acute hemorrhage or infarction.  No acute osseous abnormality.  IMPRESSION: Slightly dilated lateral ventricles which I suspect are chronic given the absence of periventricular edema.  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   Original Report Authenticated By: Francene Boyers, M.D.     1. Weakness   2. Twitching    I spoke with neurology, who felt patient needed admission and further evaluation for these involuntary movements and weakness.   MDM  MDM Reviewed: vitals, nursing note and previous chart Reviewed  previous: labs and CT scan Interpretation: labs and CT scan Consults: admitting MD and neurology            Carlyle Dolly, PA-C 09/12/12 1635  Shared service with midlevel provider. I have personally seen and examined the patient, providing direct face to face care, presenting with the chief complaint of altered mental status. Physical exam findings include restless. Plan will be to get Neuro consultation and possible transfer to [facility] for evaluation. I have reviewed the nursing documentation on past medical history, family history, and social history.   Derwood Kaplan, MD 09/12/12 1646

## 2012-09-12 NOTE — H&P (Signed)
PCP:   No primary provider on file.   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 Patient was given Ativan and was sent home yesterday but as per the caregivers patient has not responded and continues to have involuntary movements. He does have a history of seizures but as per caregivers did not have seizure-like activity. At this time patient will be admitted for further neurologic evaluation. Neurologist was called by the ED physician and they recommended to transfer the patient to [facility] for further evaluation. Patient is unable to provide any history due to his baseline mental retardation.as per caregivers patient has been not sleeping well over the past 2 weeks due to increased frequency of urination. Patient was seen by urologist and was diagnosed with BPH in February and was started on finasteride and tamsulosin.incidentally UA done in the hospital on 09/11/2012 was negative. No history of nausea vomiting or diarrhea no fever, no chest pain shortness of breath .   Allergies:   Allergies  Allergen Reactions  . Pollen Extract       Past Medical History  Diagnosis Date  . Heart murmur   . Mental retardation   . Ptosis   . Seizure disorder   . Hypertrophic obstructive cardiomyopathy     Past Surgical History  Procedure Laterality Date  . Orthopedic surgery    . Right leg surgery with plates and wires    . Hernia repair      Prior to Admission medications   Medication Sig Start Date End Date Taking? Authorizing Provider  aspirin 81 MG tablet Take 81 mg by mouth daily.     Yes Historical Provider, MD  Calcium Carbonate-Vitamin D (CALCIUM 500 + D PO) Take  by mouth 2 (two) times daily.     Yes Historical Provider, MD  carbamazepine (TEGRETOL) 200 MG tablet Take 400-500 mg by mouth See admin instructions. Pt takes 200 mg twice daily and at 8 PM  pt takes  500 mg   Yes Historical Provider, MD  finasteride (PROSCAR) 5 MG tablet Take 5 mg by mouth daily.   Yes Historical Provider, MD  polyethylene glycol (MIRALAX / GLYCOLAX) packet Take 17 g by mouth daily.   Yes Historical Provider, MD  simvastatin (ZOCOR) 10 MG tablet Take 10 mg by mouth at bedtime.     Yes Historical Provider, MD  tamsulosin (FLOMAX) 0.4 MG CAPS Take 0.4 mg by mouth daily.   Yes Historical Provider, MD    Social History:  reports that he has never smoked. He has never used smokeless tobacco. He reports that he does not drink alcohol. His drug history is not on file.  History reviewed. No pertinent family history.  Review of Systems:  As in history of present illness   rest of the review of systems unobtainable as patient is nonverbal    Physical Exam: Blood pressure 144/112, pulse 89, temperature 97.8 F (36.6 C), temperature source Axillary, resp. rate 20, weight 48.535 kg (107 lb), SpO2 98.00%. Constitutional:   Patient is appearing mildly agitated, and not very cooperative for exam  Head: Normocephalic and atraumatic Mouth: Mucus membranes moist Eyes: PERRL, EOMI, conjunctivae normal Neck: Supple, No Thyromegaly Cardiovascular: RRR, S1 normal, S2 normal.  Positive systolic murmur at the mitral area Pulmonary/Chest: CTAB, no wheezes, rales, or rhonchi Abdominal: Soft. Non-tender, non-distended, bowel sounds are normal, no masses, organomegaly, or guarding present.  Neurological: Alert, moving all extremities   Extremities : No Cyanosis, Clubbing or Edema   Labs on Admission:  Results for orders placed during the hospital encounter of 09/12/12 (from the past 48 hour(s))  BASIC METABOLIC PANEL     Status: Abnormal   Collection Time    09/12/12  1:50 PM      Result  Value Range   Sodium 139  135 - 145 mEq/L   Potassium 3.9  3.5 - 5.1 mEq/L   Chloride 104  96 - 112 mEq/L   CO2 24  19 - 32 mEq/L   Glucose, Bld 87  70 - 99 mg/dL   BUN 12  6 - 23 mg/dL   Creatinine, Ser 1.47 (*) 0.50 - 1.35 mg/dL   Calcium 9.0  8.4 - 82.9 mg/dL   GFR calc non Af Amer >90  >90 mL/min   GFR calc Af Amer >90  >90 mL/min   Comment:            The eGFR has been calculated     using the CKD EPI equation.     This calculation has not been     validated in all clinical     situations.     eGFR's persistently     <90 mL/min signify     possible Chronic Kidney Disease.  CBC WITH DIFFERENTIAL     Status: Abnormal   Collection Time    09/12/12  1:50 PM      Result Value Range   WBC 8.5  4.0 - 10.5 K/uL   RBC 4.46  4.22 - 5.81 MIL/uL   Hemoglobin 13.9  13.0 - 17.0 g/dL   HCT 56.2  13.0 - 86.5 %   MCV 89.9  78.0 - 100.0 fL   MCH 31.2  26.0 - 34.0 pg   MCHC 34.7  30.0 - 36.0 g/dL   RDW 78.4  69.6 - 29.5 %   Platelets 206  150 - 400 K/uL   Neutrophils Relative % 68  43 - 77 %   Neutro Abs 5.8  1.7 - 7.7 K/uL   Lymphocytes Relative 17  12 - 46 %   Lymphs Abs 1.5  0.7 - 4.0 K/uL   Monocytes Relative 14 (*) 3 - 12 %   Monocytes Absolute 1.2 (*) 0.1 - 1.0 K/uL   Eosinophils Relative 1  0 - 5 %   Eosinophils Absolute 0.0  0.0 - 0.7 K/uL   Basophils Relative 1  0 - 1 %   Basophils Absolute 0.0  0.0 - 0.1 K/uL  CARBAMAZEPINE LEVEL, TOTAL     Status: None   Collection Time    09/12/12  2:40 PM      Result Value Range   Carbamazepine Lvl 9.5  4.0 - 12.0 ug/mL    Radiological Exams on Admission: Ct Head Wo Contrast  09/12/2012   *RADIOLOGY REPORT*  Clinical Data: Altered mental status.  CT HEAD WITHOUT CONTRAST  Technique:  Contiguous axial images were obtained from the base of the skull through the vertex without contrast.  Comparison: 09/11/2012  Findings: Ventricular dilatation is unchanged from previous exam. Focal area of encephalomalacia involving the posterior  aspect of the right temporal lobe is unchanged from previous exam.  Stable focal area of encephalomalacia within the left frontal lobe, image  12/series 2.  There is no evidence for acute brain infarct, hemorrhage or mass.  The paranasal sinuses and mastoid air cells are clear.  The skull is intact.  IMPRESSION:  1.  Stable examination. 2.  No change in volume of the lateral ventricles. 3.  Left frontal lobe and right posterior temporal lobe encephalomalacia is unchanged.   Original Report Authenticated By: Signa Kell, M.D.   Ct Head Wo Contrast  09/11/2012   *RADIOLOGY REPORT*  Clinical Data: Change in mental status.  CT HEAD WITHOUT CONTRAST  Technique:  Contiguous axial images were obtained from the base of the skull through the vertex without contrast.  Comparison: None.  Findings: There is slight dilatation of the lateral ventricles.  There is a focal area of encephalomalacia of the posterior aspect  of the right temporal lobe, probably an old infarct. Prominent  sylvian fissure on the right.  Small focal area of encephalomalacia in the left frontal lobe  adjacent to the interhemispheric falx.   No acute hemorrhage or infarction. No acute osseous abnormality.  IMPRESSION:  Slightly dilated lateral ventricles which I suspect are chronic  given the absence of periventricular edema.   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   Original Report Authenticated By: Francene Boyers, M.D.   Ct Cervical Spine Wo Contrast  09/11/2012   **ADDENDUM** CREATED: 09/11/2012 13:45:34  CERVICAL SPINE CT: There is no fracture or subluxation or prevertebral soft tissue swelling.  No prevertebral soft tissue swelling.   Impression:  No acute abnormality of the cervical spine.  **END ADDENDUM** SIGNED BY: Allayne Gitelman. Jena Gauss, M.D.  09/11/2012   *RADIOLOGY REPORT*  Clinical Data: Possible fall.  Mental retardation.  CT CERVICAL SPINE WITHOUT CONTRAST  Technique:  Multidetector CT imaging of the  cervical spine was performed. Multiplanar CT image reconstructions were also generated.  Comparison: None.  Findings: There is slight dilatation of the lateral ventricles. There is a focal area of encephalomalacia of the posterior aspect of the right temporal lobe, probably an old infarct.  Prominent sylvian fissure on the right.  Small focal area of encephalomalacia in the left frontal lobe adjacent to the interhemispheric falx.  No acute hemorrhage or infarction.  No acute osseous abnormality.  IMPRESSION: Slightly dilated lateral ventricles which I suspect are chronic given the absence of periventricular edema.  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   Original Report Authenticated By: Francene Boyers, M.D.    Assessment/Plan Mental retardation Involuntary movements seizure disorder Benign prostate hyperplasia  Involuntary movements Patient may be having underlying seizures so we'll require neurology evaluation and possible EEG at [facility]. Also patient has been having insomnia over the past 2 weeks which could also present like this. CT scan head done in the ED did not show significant abnormality. May benefit from MRI.  Seizure disorder Patient will be continued on carbamazepine  Benign prostate hyperplasia Patient was seen by urology and was started on tamsulosin and finesteride Will again obtain a UA and urine culture as patient may have underlying subclinical prostatitis, which could present with increased frequency of urination despite being on above medications.  DVT prophylaxis Lovenox  Time Spent on Admission: 55 min  LAMA,GAGAN S [facility] Pager: [phone] 09/12/2012, 4:55 PM

## 2012-09-13 ENCOUNTER — Inpatient Hospital Stay (HOSPITAL_COMMUNITY): Payer: Medicare Other

## 2012-09-13 DIAGNOSIS — G9340 Encephalopathy, unspecified: Secondary | ICD-10-CM

## 2012-09-13 DIAGNOSIS — R569 Unspecified convulsions: Secondary | ICD-10-CM

## 2012-09-13 DIAGNOSIS — R5381 Other malaise: Secondary | ICD-10-CM

## 2012-09-13 DIAGNOSIS — I517 Cardiomegaly: Secondary | ICD-10-CM

## 2012-09-13 LAB — COMPREHENSIVE METABOLIC PANEL
AST: 38 U/L — ABNORMAL HIGH (ref 0–37)
Albumin: 3.4 g/dL — ABNORMAL LOW (ref 3.5–5.2)
CO2: 20 mEq/L (ref 19–32)
Calcium: 8.3 mg/dL — ABNORMAL LOW (ref 8.4–10.5)
Creatinine, Ser: 0.5 mg/dL (ref 0.50–1.35)
GFR calc non Af Amer: >90 mL/min (ref >90)

## 2012-09-13 LAB — CBC
MCH: 30.8 pg (ref 26.0–34.0)
MCHC: 33.7 g/dL (ref 30.0–36.0)
MCV: 91.5 fL (ref 78.0–100.0)
Platelets: 194 10*3/uL (ref 150–400)
RDW: 13.1 % (ref 11.5–15.5)

## 2012-09-13 LAB — URINE CULTURE: Culture: NO GROWTH

## 2012-09-13 MED ORDER — LORAZEPAM 2 MG/ML IJ SOLN
2.0000 mg | Freq: Once | INTRAMUSCULAR | Status: AC
  Administered 2012-09-13: 2 mg via INTRAVENOUS
  Filled 2012-09-13: qty 1

## 2012-09-13 NOTE — Progress Notes (Signed)
I was asked to see Mr. Derek Chandler s/p transfer from [facility]. Pt was evaluated at [facility] ED last night because he was not moving the left side, was less responsive, and had developed abnormal movements involving mainly the lower limbs. CT brain was unremarkable for acute abnormalities and tegretol level was slightly elevated.  His symptoms did not improve and was taken back to [facility] ED today. A family member reported the movements " jerkiness" started just few days ago and he can not pinpoint to a reason for this new development. No new medications or medical illnesses. At bedside pt noted sleeping in no acute distress. VSS. Spoke with his brother Brett Canales) who is at bedside who relates pt has not slept well in > 24 hrs so it is good to see him sleep. Will continue to monitor closely.  Leanne Chang, NP-C [facility] Pager [phone]

## 2012-09-13 NOTE — Progress Notes (Addendum)
EEG completed; per Camilo, 13 min OK as pts Ativan wore off and removed leads.

## 2012-09-13 NOTE — Progress Notes (Signed)
[facility] PROGRESS NOTE  Derek Chandler WUJ:[id number] DOB: 09-Feb-1961 DOA: 09/12/2012 PCP: No primary provider on file.  Assessment/Plan: Acute Encephalopathy -Per family back to baseline. -They report 2 distinct episodes of right leg weakness and transient fixed gaze. -EEG negative for seizure activity. -MRI pending as will need to be reordered with sedation to r/o CVA. -TIA is a possibility. -ECHO/dopplers pending.  Seizure Disorder -No active seizure activity in the hospital. -Continue Tegretol at home dose.  Mental Retardation  DVT Prophylaxis -Lovenox.  Code Status: Full Family Communication: Sister at bedside   Disposition Plan: Back to group home when workup complete (24-48 hours)   Consultants:  Neurology, Dr. Leroy Kennedy   Antibiotics:  None   Subjective: Non-verbal. Per family back to baseline mental status  Objective: Filed Vitals:   09/12/12 2100 09/13/12 0644 09/13/12 1057 09/13/12 1358  BP: 92/73 105/62 112/58 100/44  Pulse: 85 82 76 77  Temp: 98.3 F (36.8 C)  97.5 F (36.4 C) 98.2 F (36.8 C)  TempSrc: Axillary  Axillary Axillary  Resp: 14 16 18 18   Height:      Weight:      SpO2: 97% 100% 100% 100%    Intake/Output Summary (Last 24 hours) at 09/13/12 1630 Last data filed at 09/13/12 1300  Gross per 24 hour  Intake    720 ml  Output      0 ml  Net    720 ml   Filed Weights   09/12/12 1240 09/12/12 1900  Weight: 48.535 kg (107 lb) 52.164 kg (115 lb)    Exam:   General:  Nonverbal, does not follow commands, moving all 4 extremities spontaneously.  Cardiovascular: RRR, no M/R/G   Respiratory: CTA B  Abdomen: S/NT/+BS  Extremities: no C/C/E   Data Reviewed: Basic Metabolic Panel:  Recent Labs Lab 09/11/12 1250 09/12/12 1350 09/12/12 1855 09/13/12 0515  NA 139 139  --  137  K 4.1 3.9  --  3.5  CL 103 104  --  102  CO2 23 24  --  20  GLUCOSE 94 87  --  80  BUN 17 12  --  13  CREATININE 0.51 0.44* 0.46* 0.50   CALCIUM 9.7 9.0  --  8.3*   Liver Function Tests:  Recent Labs Lab 09/11/12 1250 09/13/12 0515  AST 36 38*  ALT 20 22  ALKPHOS 59 48  BILITOT 0.3 0.2*  PROT 7.1 6.0  ALBUMIN 4.3 3.4*   No results found for this basename: LIPASE, AMYLASE,  in the last 168 hours No results found for this basename: AMMONIA,  in the last 168 hours CBC:  Recent Labs Lab 09/11/12 1250 09/12/12 1350 09/12/12 1855 09/13/12 0515  WBC 12.9* 8.5 9.1 7.8  NEUTROABS 9.7* 5.8  --   --   HGB 14.8 13.9 13.7 13.1  HCT 42.4 40.1 39.5 38.9*  MCV 89.5 89.9 90.6 91.5  PLT 232 206 192 194   Cardiac Enzymes: No results found for this basename: CKTOTAL, CKMB, CKMBINDEX, TROPONINI,  in the last 168 hours BNP (last 3 results) No results found for this basename: PROBNP,  in the last 8760 hours CBG: No results found for this basename: GLUCAP,  in the last 168 hours  No results found for this or any previous visit (from the past 240 hour(s)).   Studies: Ct Head Wo Contrast  09/12/2012   *RADIOLOGY REPORT*  Clinical Data: Altered mental status.  CT HEAD WITHOUT CONTRAST  Technique:  Contiguous axial images  were obtained from the base of the skull through the vertex without contrast.  Comparison: 09/11/2012  Findings: Ventricular dilatation is unchanged from previous exam. Focal area of encephalomalacia involving the posterior aspect of the right temporal lobe is unchanged from previous exam.  Stable focal area of encephalomalacia within the left frontal lobe, image 12/series 2.  There is no evidence for acute brain infarct, hemorrhage or mass.  The paranasal sinuses and mastoid air cells are clear.  The skull is intact.  IMPRESSION:  1.  Stable examination. 2.  No change in volume of the lateral ventricles. 3.  Left frontal lobe and right posterior temporal lobe encephalomalacia is unchanged.   Original Report Authenticated By: Signa Kell, M.D.    Scheduled Meds: . aspirin EC  81 mg Oral Daily  . carbamazepine   400 mg Oral Custom   And  . carbamazepine  500 mg Oral QHS  . enoxaparin (LOVENOX) injection  40 mg Subcutaneous Q24H  . finasteride  5 mg Oral Daily  . LORazepam  2 mg Intravenous Once  . polyethylene glycol  17 g Oral Daily  . simvastatin  10 mg Oral QHS  . sodium chloride  3 mL Intravenous Q12H  . tamsulosin  0.4 mg Oral Daily   Continuous Infusions: . sodium chloride 75 mL/hr at 09/12/12 2100    Principal Problem:   Acute encephalopathy Active Problems:   Mental retardation   Seizure disorder    Time spent: 35 minutes.    Derek Chandler  [facility] Pager [phone]  If 7PM-7AM, please contact night-coverage at [email or web address], password [facility] 09/13/2012, 4:30 PM  LOS: 1 day

## 2012-09-13 NOTE — Procedures (Signed)
EEG report.  Brief clinical history:  52 years old male with MR and new onset intermittent, irregular movements lower extremities. 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  Technique: this is a 17 channel routine scalp EEG performed at the bedside with bipolar and monopolar montages arranged in accordance to the international 10/20 system of electrode placement. One channel was dedicated to EKG recording.  Patient very restless and required sedation with IV ativan. No activating procedures.  EEG Description: the best background was characterized by a medium amplitude, posterior dominant 9 Hz rhythm that is well sustained, symmetric and reactive..  No focal or generalized epileptiform discharges noted.  No slowing seen.  EKG showed sinus rhythm.  Impression: this is a normal EEG performed with sedation. Please, be aware that a normal EEG does not exclude the possibility of epilepsy.  Clinical correlation is advised.  Derek Portela, MD

## 2012-09-13 NOTE — Progress Notes (Signed)
MRI was attempted s/p pt receiving 2mg  ativan. Pt still agitated and unable to cooperate for MRI. Pt is a candidate for conscious sedation (radiology versed/fentyl protocol). Please let dept know how we need to proceed with exam.

## 2012-09-13 NOTE — Progress Notes (Signed)
NEURO HOSPITALIST PROGRESS NOTE   SUBJECTIVE:                                                                                                                        Patient is back to his baseline per sister who is in the room.   OBJECTIVE:                                                                                                                           Vital signs in last 24 hours: Temp:  [97.5 F (36.4 C)-98.6 F (37 C)] 98.2 F (36.8 C) (05/23 1358) Pulse Rate:  [76-99] 77 (05/23 1358) Resp:  [14-22] 18 (05/23 1358) BP: (92-144)/(44-112) 100/44 mmHg (05/23 1358) SpO2:  [97 %-100 %] 100 % (05/23 1358) Weight:  [52.164 kg (115 lb)] 52.164 kg (115 lb) (05/22 1900)  Intake/Output from previous day: 05/22 0701 - 05/23 0700 In: 360 [P.O.:360] Out: -  Intake/Output this shift: Total I/O In: 360 [P.O.:360] Out: -  Nutritional status: Cardiac  Past Medical History  Diagnosis Date  . Heart murmur   . Mental retardation   . Ptosis   . Seizure disorder   . Hypertrophic obstructive cardiomyopathy      Neurologic Exam:  Mental Status: Nonverbal, follows no verbal commands but sitting up in bed moving all extremities, he recognizes his sister and will make motions for her to come over.  HE will often hit himself but this is normal per sister.  Cranial Nerves: ZO:[id number] to threat bilaterally, pupils equal, round, reactive to light III,IV, VI: both eyes are esotropic (beaseline per sister) V,VII: face symmetric, VIII: hearing normal bilaterally XI: bilateral shoulder shrug XII: midline tongue extension Motor: Moving all extremities antigravity with good strength.  Sensory: withdraws from pain in all extremities Deep Tendon Reflexes: 2+bilateral UE, 3+ bilateral KJ and AJ--clonus noted at both ankles (his baseline per sister) Plantars: Up going bilaterally    Lab Results: No results found for this basename: cbc, bmp, coags,  chol, tri, ldl, hga1c   Lipid Panel No results found for this basename: CHOL, TRIG, HDL, CHOLHDL, VLDL, LDLCALC,  in the last 72 hours  Studies/Results: Ct Head Wo Contrast  09/12/2012   *RADIOLOGY REPORT*  Clinical Data: Altered mental status.  CT HEAD WITHOUT CONTRAST  Technique:  Contiguous axial images were obtained from the base of the skull through the vertex without contrast.  Comparison: 09/11/2012  Findings: Ventricular dilatation is unchanged from previous exam. Focal area of encephalomalacia involving the posterior aspect of the right temporal lobe is unchanged from previous exam.  Stable focal area of encephalomalacia within the left frontal lobe, image 12/series 2.  There is no evidence for acute brain infarct, hemorrhage or mass.  The paranasal sinuses and mastoid air cells are clear.  The skull is intact.  IMPRESSION:  1.  Stable examination. 2.  No change in volume of the lateral ventricles. 3.  Left frontal lobe and right posterior temporal lobe encephalomalacia is unchanged.   Original Report Authenticated By: Signa Kell, M.D.    MEDICATIONS                                                                                                                        Scheduled: . aspirin EC  81 mg Oral Daily  . carbamazepine  400 mg Oral Custom   And  . carbamazepine  500 mg Oral QHS  . enoxaparin (LOVENOX) injection  40 mg Subcutaneous Q24H  . finasteride  5 mg Oral Daily  . polyethylene glycol  17 g Oral Daily  . simvastatin  10 mg Oral QHS  . sodium chloride  3 mL Intravenous Q12H  . tamsulosin  0.4 mg Oral Daily    ASSESSMENT/PLAN:                                                                                                           52 years old with MR, symptomatic seizures ( seizure free on tegretol for many years), admitted with recent changes in MS and abnormal movements involving mainly lower extremities. EEG showed no epileptiform activity and patient is back to his  baseline per sister.  MRI is pending as he was agitated on prior attempt.   Recommend: 1) current Tegretol regime 2) MRI brain to evaluate for stroke  If MRI is negative, no further recommendations.     Assessment and plan discussed with with attending physician and they are in agreement.    Felicie Morn PA-C [facility] [phone]  09/13/2012, 3:31 PM

## 2012-09-13 NOTE — Progress Notes (Signed)
Utilization Review Completed.   Stclair Szymborski, RN, BSN Nurse Case Manager  [phone]  

## 2012-09-13 NOTE — Progress Notes (Signed)
  Echocardiogram 2D Echocardiogram has been performed.  Georgian Co 09/13/2012, 6:36 PM

## 2012-09-14 ENCOUNTER — Inpatient Hospital Stay (HOSPITAL_COMMUNITY): Payer: Medicare Other

## 2012-09-14 MED ORDER — MIDAZOLAM HCL 2 MG/2ML IJ SOLN
2.0000 mg | Freq: Once | INTRAMUSCULAR | Status: AC
  Administered 2012-09-14: 4 mg via INTRAVENOUS

## 2012-09-14 NOTE — Progress Notes (Signed)
VASCULAR LAB PRELIMINARY  PRELIMINARY  PRELIMINARY  PRELIMINARY  Carotid Dopplers completed.    Preliminary report:  There is no significant ICA stenosis.  Vertebral artery flow is antegrade.  Darrelle Wiberg, RVT 09/14/2012, 4:43 PM

## 2012-09-14 NOTE — Progress Notes (Signed)
Patient received 4 mg versed for MRI procedure tolerated well, report given to charge nurse Sharlyne Pacas RN on unit [facility]North.

## 2012-09-14 NOTE — Progress Notes (Signed)
Clinical Social Work Department BRIEF PSYCHOSOCIAL ASSESSMENT 09/14/2012  Patient:  Derek Chandler, Derek Chandler     Account Number:  [id number]     Admit date:  09/12/2012  Clinical Social Worker:  Candie Chroman  Date/Time:  09/14/2012 12:52 PM  Referred by:  Physician  Date Referred:  09/14/2012 Referred for  Other - See comment   Other Referral:   Pt from group home.   Interview type:  Family Other interview type:    PSYCHOSOCIAL DATA Living Status:  FACILITY Admitted from facility:   Level of care:  Group Home Primary support name:  Timoth Schara Primary support relationship to patient:  CHILD, ADULT Degree of support available:   supportive    CURRENT CONCERNS Current Concerns  Post-Acute Placement   Other Concerns:    SOCIAL WORK ASSESSMENT / PLAN Pt is a 52 yr old gentleman with MR admitted from [facility] . CSW met with pt's sister to assist with d/c planning. Family expects pt to return to group home once medically stable. Unable to confirm d/c plan with group at this time. Will request week day csw to contact [facility] on Monday to confirm d/c plan.   Assessment/plan status:  Psychosocial Support/Ongoing Assessment of Needs Other assessment/ plan:   Information/referral to community resources:   None needed at this time.    PATIENT'S/FAMILY'S RESPONSE TO PLAN OF CARE: Family has been very pleased with the care pt has received at [facility]. They are looking forward to pt returning to group home when stable.   Cori Razor LCSW [phone]

## 2012-09-14 NOTE — Progress Notes (Signed)
[facility] PROGRESS NOTE  Derek Chandler WUX:[id number] DOB: 1960-05-11 DOA: 09/12/2012 PCP: No primary provider on file.  Assessment/Plan: Acute Encephalopathy -Per family back to baseline. -They report 2 distinct episodes of right leg weakness and transient fixed gaze. -EEG negative for seizure activity. -MRI pending as will need to be reordered with sedation to r/o CVA. -TIA is a possibility. -ECHO: There is septal hypertrophy with chordal SAM but no significant outflow tract gradient The cavity size was normal. Systolic function was normal. The estimated ejection fraction was in the range of 60% to 65%. Wall motion was normal; there were no regional wall motion abnormalities.   Seizure Disorder -No active seizure activity in the hospital. -Continue Tegretol at home dose.  Mental Retardation  DVT Prophylaxis -Lovenox.  Code Status: Full Family Communication: Brother Viviann Spare at bedside   Disposition Plan: Brother will take him home with him tomorrow if medically ready. Should be able to go pending MRI results.   Consultants:  Neurology, Dr. Leroy Kennedy   Antibiotics:  None   Subjective: Non-verbal. Per family back to baseline mental status  Objective: Filed Vitals:   09/13/12 1358 09/13/12 1900 09/14/12 0604 09/14/12 1006  BP: 100/44 113/61 121/62 125/63  Pulse: 77 90 86 81  Temp: 98.2 F (36.8 C) 97.2 F (36.2 C) 97.7 F (36.5 C) 97.3 F (36.3 C)  TempSrc: Axillary Axillary Axillary Axillary  Resp: 18 18 18 18   Height:      Weight:      SpO2: 100% 96% 98% 100%    Intake/Output Summary (Last 24 hours) at 09/14/12 1155 Last data filed at 09/13/12 1900  Gross per 24 hour  Intake    760 ml  Output      0 ml  Net    760 ml   Filed Weights   09/12/12 1240 09/12/12 1900  Weight: 48.535 kg (107 lb) 52.164 kg (115 lb)    Exam:   General:  Nonverbal, does not follow commands, moving all 4 extremities spontaneously.  Cardiovascular: RRR, no  M/R/G   Respiratory: CTA B  Abdomen: S/NT/+BS  Extremities: no C/C/E   Data Reviewed: Basic Metabolic Panel:  Recent Labs Lab 09/11/12 1250 09/12/12 1350 09/12/12 1855 09/13/12 0515  NA 139 139  --  137  K 4.1 3.9  --  3.5  CL 103 104  --  102  CO2 23 24  --  20  GLUCOSE 94 87  --  80  BUN 17 12  --  13  CREATININE 0.51 0.44* 0.46* 0.50  CALCIUM 9.7 9.0  --  8.3*   Liver Function Tests:  Recent Labs Lab 09/11/12 1250 09/13/12 0515  AST 36 38*  ALT 20 22  ALKPHOS 59 48  BILITOT 0.3 0.2*  PROT 7.1 6.0  ALBUMIN 4.3 3.4*   No results found for this basename: LIPASE, AMYLASE,  in the last 168 hours No results found for this basename: AMMONIA,  in the last 168 hours CBC:  Recent Labs Lab 09/11/12 1250 09/12/12 1350 09/12/12 1855 09/13/12 0515  WBC 12.9* 8.5 9.1 7.8  NEUTROABS 9.7* 5.8  --   --   HGB 14.8 13.9 13.7 13.1  HCT 42.4 40.1 39.5 38.9*  MCV 89.5 89.9 90.6 91.5  PLT 232 206 192 194   Cardiac Enzymes: No results found for this basename: CKTOTAL, CKMB, CKMBINDEX, TROPONINI,  in the last 168 hours BNP (last 3 results) No results found for this basename: PROBNP,  in the last 8760 hours  CBG: No results found for this basename: GLUCAP,  in the last 168 hours  Recent Results (from the past 240 hour(s))  URINE CULTURE     Status: None   Collection Time    09/12/12  5:00 PM      Result Value Range Status   Specimen Description URINE, CLEAN CATCH   Final   Special Requests NONE   Final   Culture  Setup Time 09/12/2012 22:01   Final   Colony Count NO GROWTH   Final   Culture NO GROWTH   Final   Report Status 09/13/2012 FINAL   Final     Studies: Ct Head Wo Contrast  09/12/2012   *RADIOLOGY REPORT*  Clinical Data: Altered mental status.  CT HEAD WITHOUT CONTRAST  Technique:  Contiguous axial images were obtained from the base of the skull through the vertex without contrast.  Comparison: 09/11/2012  Findings: Ventricular dilatation is unchanged from  previous exam. Focal area of encephalomalacia involving the posterior aspect of the right temporal lobe is unchanged from previous exam.  Stable focal area of encephalomalacia within the left frontal lobe, image 12/series 2.  There is no evidence for acute brain infarct, hemorrhage or mass.  The paranasal sinuses and mastoid air cells are clear.  The skull is intact.  IMPRESSION:  1.  Stable examination. 2.  No change in volume of the lateral ventricles. 3.  Left frontal lobe and right posterior temporal lobe encephalomalacia is unchanged.   Original Report Authenticated By: Signa Kell, M.D.    Scheduled Meds: . aspirin EC  81 mg Oral Daily  . carbamazepine  400 mg Oral Custom   And  . carbamazepine  500 mg Oral QHS  . enoxaparin (LOVENOX) injection  40 mg Subcutaneous Q24H  . finasteride  5 mg Oral Daily  . midazolam  2-4 mg Intravenous Once  . polyethylene glycol  17 g Oral Daily  . simvastatin  10 mg Oral QHS  . sodium chloride  3 mL Intravenous Q12H  . tamsulosin  0.4 mg Oral Daily   Continuous Infusions: . sodium chloride 75 mL/hr at 09/12/12 2100    Principal Problem:   Acute encephalopathy Active Problems:   Mental retardation   Seizure disorder    Time spent: 35 minutes.    Chaya Jan  [facility] Pager [phone]  If 7PM-7AM, please contact night-coverage at [email or web address], password [facility] 09/14/2012, 11:55 AM  LOS: 2 days

## 2012-09-14 NOTE — Progress Notes (Signed)
[facility] on call notified regarding patient not able to tolerate MRI with ativan. No new orders received.

## 2012-09-15 DIAGNOSIS — I6350 Cerebral infarction due to unspecified occlusion or stenosis of unspecified cerebral artery: Principal | ICD-10-CM

## 2012-09-15 DIAGNOSIS — I639 Cerebral infarction, unspecified: Secondary | ICD-10-CM

## 2012-09-15 MED ORDER — CLOPIDOGREL BISULFATE 75 MG PO TABS
75.0000 mg | ORAL_TABLET | Freq: Every day | ORAL | Status: DC
  Administered 2012-09-16 – 2012-09-17 (×2): 75 mg via ORAL
  Filled 2012-09-15 (×3): qty 1

## 2012-09-15 NOTE — Progress Notes (Signed)
[facility] PROGRESS NOTE  Derek Chandler AVW:[id number] DOB: [birth year] DOA: 09/12/2012 PCP: No primary provider on file.  Assessment/Plan: Acute Encephalopathy 2/2 CVA -Per family back to baseline. -They report 2 distinct episodes of right leg weakness and transient fixed gaze. -EEG negative for seizure activity. -MRI revealed numerous scattered foci of acute infarction in the right temporal lobe and temporoparietal region consistent with either embolic infarctions or right MCA branch vessel infarction. -Will need TEE. Have discussed with [facility] cards to arrange, unfortunately this cannot be done until Tuesday. -ECHO: There is septal hypertrophy with chordal SAM but no significant outflow tract gradient The cavity size was normal. Systolic function was normal. The estimated ejection fraction was in the range of 60% to 65%. Wall motion was normal; there were no regional wall motion abnormalities.  Seizure Disorder -No active seizure activity in the hospital. -Continue Tegretol at home dose.  Mental Retardation  DVT Prophylaxis -Lovenox.  Code Status: Full Family Communication: Brother Viviann Spare at bedside   Disposition Plan: Back to group home when ready, suspect not until Wednesday given timing of TEE.  Consultants:  Neurology, Dr. Leroy Kennedy   Antibiotics:  None   Subjective: Non-verbal. Per family back to baseline mental status  Objective: Filed Vitals:   09/14/12 2200 09/15/12 0200 09/15/12 0600 09/15/12 0900  BP: 113/65 117/62 127/59 113/50  Pulse: 72 76 83 73  Temp: 97.5 F (36.4 C) 97.5 F (36.4 C) 97.5 F (36.4 C) 97.6 F (36.4 C)  TempSrc:    Axillary  Resp: 20 20 20 18   Height:      Weight:      SpO2: 96% 96% 95% 100%    Intake/Output Summary (Last 24 hours) at 09/15/12 1119 Last data filed at 09/14/12 1700  Gross per 24 hour  Intake    720 ml  Output      0 ml  Net    720 ml   Filed Weights   09/12/12 1240 09/12/12 1900  Weight: 48.535 kg  (107 lb) 52.164 kg (115 lb)    Exam:   General:  Nonverbal, does not follow commands, moving all 4 extremities spontaneously.  Cardiovascular: RRR, no M/R/G   Respiratory: CTA B  Abdomen: S/NT/+BS  Extremities: no C/C/E   Data Reviewed: Basic Metabolic Panel:  Recent Labs Lab 09/11/12 1250 09/12/12 1350 09/12/12 1855 09/13/12 0515  NA 139 139  --  137  K 4.1 3.9  --  3.5  CL 103 104  --  102  CO2 23 24  --  20  GLUCOSE 94 87  --  80  BUN 17 12  --  13  CREATININE 0.51 0.44* 0.46* 0.50  CALCIUM 9.7 9.0  --  8.3*   Liver Function Tests:  Recent Labs Lab 09/11/12 1250 09/13/12 0515  AST 36 38*  ALT 20 22  ALKPHOS 59 48  BILITOT 0.3 0.2*  PROT 7.1 6.0  ALBUMIN 4.3 3.4*   No results found for this basename: LIPASE, AMYLASE,  in the last 168 hours No results found for this basename: AMMONIA,  in the last 168 hours CBC:  Recent Labs Lab 09/11/12 1250 09/12/12 1350 09/12/12 1855 09/13/12 0515  WBC 12.9* 8.5 9.1 7.8  NEUTROABS 9.7* 5.8  --   --   HGB 14.8 13.9 13.7 13.1  HCT 42.4 40.1 39.5 38.9*  MCV 89.5 89.9 90.6 91.5  PLT 232 206 192 194   Cardiac Enzymes: No results found for this basename: CKTOTAL, CKMB, CKMBINDEX, TROPONINI,  in the last 168 hours BNP (last 3 results) No results found for this basename: PROBNP,  in the last 8760 hours CBG: No results found for this basename: GLUCAP,  in the last 168 hours  Recent Results (from the past 240 hour(s))  URINE CULTURE     Status: None   Collection Time    09/12/12  5:00 PM      Result Value Range Status   Specimen Description URINE, CLEAN CATCH   Final   Special Requests NONE   Final   Culture  Setup Time 09/12/2012 22:01   Final   Colony Count NO GROWTH   Final   Culture NO GROWTH   Final   Report Status 09/13/2012 FINAL   Final     Studies: [name] Contrast  09/14/2012   *RADIOLOGY REPORT*  Clinical Data: Altered mental status.  Stroke or seizure on Tuesday.  MRI HEAD WITHOUT  CONTRAST  Technique:  Multiplanar, multiecho pulse sequences of the brain and surrounding structures were obtained according to standard protocol without intravenous contrast.  Comparison: Head CT 09/12/2012.  Head CT 09/11/2012.  Findings: Diffusion imaging shows multiple foci of acute infarction in the right temporal and temporoparietal region consistent with either embolic disease or right MCA branch vessel occlusion.  There are also old ischemic changes in the right temporo-parietal junction region.  The brainstem and cerebellum are unremarkable.  The cerebral hemispheres otherwise show mild chronic small vessel disease. Ventricular size is stable.  No evidence of hemorrhage or extra- axial collection.  No sign of mass lesion.  Major vessels at the base of the brain show flow.  IMPRESSION: Numerous scattered foci of acute infarction in the right temporal lobe and temporoparietal region consistent with either embolic infarctions or right MCA branch vessel infarction.  No evidence of swelling, hemorrhage or mass effect.   Original Report Authenticated By: Paulina Fusi, M.D.    Scheduled Meds: . aspirin EC  81 mg Oral Daily  . carbamazepine  400 mg Oral Custom   And  . carbamazepine  500 mg Oral QHS  . enoxaparin (LOVENOX) injection  40 mg Subcutaneous Q24H  . finasteride  5 mg Oral Daily  . polyethylene glycol  17 g Oral Daily  . simvastatin  10 mg Oral QHS  . sodium chloride  3 mL Intravenous Q12H  . tamsulosin  0.4 mg Oral Daily   Continuous Infusions: . sodium chloride 75 mL/hr at 09/12/12 2100    Principal Problem:   CVA (cerebral infarction) Active Problems:   Acute encephalopathy   Mental retardation   Seizure disorder    Time spent: 35 minutes.    Chaya Jan  [facility] Pager [phone]  If 7PM-7AM, please contact night-coverage at [email or web address], password [facility] 09/15/2012, 11:19 AM  LOS: 3 days

## 2012-09-15 NOTE — Progress Notes (Signed)
MRI-DWI  revealed numerous scattered foci of acute infarction in the right temporal lobe and temporoparietal region consistent with either embolic infarctions or right MCA branch vessel infarction. Will ask stroke team to see patient and make further recommendations.  Wyatt Portela, MD [facility].

## 2012-09-15 NOTE — Progress Notes (Signed)
Stroke Team Progress Note  HISTORY  Derek Chandler is a 52 y.o. male right handed with a past medical history significant for mental retardation, seizures, hypertrophic obstructive cardiomyopathy, transferred to [facility] for further evaluation of declining mental status and abnormal movements.  Patient is unable to communicate but his father said that Derek Chandler takes tegretol and hasn't had a seizure in more than 20 years.  He was evaluated at [facility] ED last 09/11/12 because he was not moving the left side, was less responsive, and had developed abnormal movements involving mainly the lower limbs. CT brain was unremarkable for acute abnormalities and tegretol level was slightly elevated.  His symptoms did not improve and was taken back to [facility] ED 09/12/12..  According to his father, the movements " jerkiness" started just few days ago and he could not pinpoint to a rerqson for this new development.  No new medications or medical illnesses.   Patient was not a TPA candidate secondary to no acute infarct initially noted. He was admitted for further evaluation.  SUBJECTIVE The patient's brother, who is also his main caregiver, was in the room today. The patient made no attempts to communicate with me; although, he did communicate with his brother using grunts and occasional single words. His brother and I had a long talk regarding the patient. The patient's brother feels he is back to baseline at this time. The patient is anxious to return to his group home.  OBJECTIVE Most recent Vital Signs: Filed Vitals:   09/14/12 2200 09/15/12 0200 09/15/12 0600 09/15/12 0900  BP: 113/65 117/62 127/59 113/50  Pulse: 72 76 83 73  Temp: 97.5 F (36.4 C) 97.5 F (36.4 C) 97.5 F (36.4 C) 97.6 F (36.4 C)  TempSrc:    Axillary  Resp: 20 20 20 18   Height:      Weight:      SpO2: 96% 96% 95% 100%   CBG (last 3)  No results found for this basename: GLUCAP,  in the last 72 hours  IV Fluid Intake:   . sodium chloride  75 mL/hr at 09/12/12 2100    MEDICATIONS  . aspirin EC  81 mg Oral Daily  . carbamazepine  400 mg Oral Custom   And  . carbamazepine  500 mg Oral QHS  . enoxaparin (LOVENOX) injection  40 mg Subcutaneous Q24H  . finasteride  5 mg Oral Daily  . polyethylene glycol  17 g Oral Daily  . simvastatin  10 mg Oral QHS  . sodium chloride  3 mL Intravenous Q12H  . tamsulosin  0.4 mg Oral Daily   PRN:  sodium chloride, sodium chloride  Diet:  Cardiac thin liquids Activity:  Out of bed with assistance DVT Prophylaxis:  Lovenox  CLINICALLY SIGNIFICANT STUDIES Basic Metabolic Panel:  Recent Labs Lab 09/12/12 1350 09/12/12 1855 09/13/12 0515  NA 139  --  137  K 3.9  --  3.5  CL 104  --  102  CO2 24  --  20  GLUCOSE 87  --  80  BUN 12  --  13  CREATININE 0.44* 0.46* 0.50  CALCIUM 9.0  --  8.3*   Liver Function Tests:  Recent Labs Lab 09/11/12 1250 09/13/12 0515  AST 36 38*  ALT 20 22  ALKPHOS 59 48  BILITOT 0.3 0.2*  PROT 7.1 6.0  ALBUMIN 4.3 3.4*   CBC:  Recent Labs Lab 09/11/12 1250 09/12/12 1350 09/12/12 1855 09/13/12 0515  WBC 12.9* 8.5 9.1 7.8  NEUTROABS 9.7* 5.8  --   --   HGB 14.8 13.9 13.7 13.1  HCT 42.4 40.1 39.5 38.9*  MCV 89.5 89.9 90.6 91.5  PLT 232 206 192 194   Coagulation: No results found for this basename: LABPROT, INR,  in the last 168 hours Cardiac Enzymes: No results found for this basename: CKTOTAL, CKMB, CKMBINDEX, TROPONINI,  in the last 168 hours Urinalysis:  Recent Labs Lab 09/11/12 1327 09/12/12 1700  COLORURINE YELLOW AMBER*  LABSPEC 1.028 1.031*  PHURINE 5.0 5.0  GLUCOSEU NEGATIVE NEGATIVE  HGBUR NEGATIVE NEGATIVE  BILIRUBINUR NEGATIVE SMALL*  KETONESUR >80* >80*  PROTEINUR NEGATIVE NEGATIVE  UROBILINOGEN 0.2 0.2  NITRITE NEGATIVE NEGATIVE  LEUKOCYTESUR NEGATIVE NEGATIVE   Lipid Panel No results found for this basename: chol, trig, hdl, cholhdl, vldl, ldlcalc   HgbA1C  No results found for this basename: HGBA1C     Urine Drug Screen:   No results found for this basename: labopia, cocainscrnur, labbenz, amphetmu, thcu, labbarb    Alcohol Level: No results found for this basename: ETH,  in the last 168 hours   CT of the brain  09/11/12 Slightly dilated lateral ventricles which I suspect are chronic given the absence of periventricular edema.  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   CT of the brain  09/12/12 1. Stable examination.  2. No change in volume of the lateral ventricles.  3. Left frontal lobe and right posterior temporal lobe  encephalomalacia is unchanged.   [name] Contrast  09/14/2012 Numerous scattered foci of acute infarction in the right temporal lobe and temporoparietal region consistent with either embolic infarctions or right MCA branch vessel infarction.  No evidence of swelling, hemorrhage or mass effect.     MRA of the brain     2D Echocardiogram  ejection fraction 60-65%. No cardiac source of emboli identified.  TEE - pending  Carotid Doppler Preliminary report: There is no significant ICA stenosis. Vertebral artery flow is antegrade.  CXR    EKG   EEG - 09/13/12 - this is a normal EEG performed with sedation.    Therapy Recommendations - pending  Physical Exam    General - 52 year old male sitting crosslegged on the bed rocking back and forth. The patient was uncooperative with the exam and made no attempts to follow any directions. Heart - Regular rate and rhythm - 2/6 systolic murmur. Lungs - Clear to auscultation Skin - Warm and dry. Several small diffuse ecchymotic areas were noted.  NEUROLOGIC:   MENTAL STATUS: awake, alert, the patient made no attempts to communicate or follow commands.  CRANIAL NERVES: Disconjugate eye movements MOTOR: normal bulk and tone, NO FOCAL WEAKNESS. SENSORY: MOVES TO STIM GAIT: STANDS AND WALKS WITH SOME ASSISTANCE.  ASSESSMENT Derek Chandler is a 52 y.o. male  presenting with declining mental status and abnormal movements. IV t-PA not given as an acute infarct was not initially noted. The time last known well could not be determined. An MRI on 09/14/12 showed numerous scattered foci of acute infarction in the right temporal lobe and temporoparietal region consistent with either embolic infarctions or right MCA branch vessel infarction. On aspirin 81 mg orally every day prior to admission. Now on aspirin 81 mg orally every day for secondary stroke prevention. Patient with resultant apparent minimal deficits related to his acute infarctions. Work up underway.   Seizure disorder  Mental retardation  Hypertrophic obstructive cardiomyopathy  Ptosis history  Dyslipidemia -  Zocor prior to admission  Hospital day # 3  TREATMENT/PLAN  Change aspirin to plavix 75mg  daily  Await therapist evaluations  Seizure history - continue Tegretol PTA  Risk factor modification  Check lipid profile and hemoglobin A1c  Await TEE   Delton See PA-C [facility] Pager [phone] 09/15/2012, 12:03 PM   I have personally obtained a history, examined the patient, evaluated imaging results, and formulated the assessment and plan of care. I agree with the above. Change aspirin to plavix 75mg  daily. Follow up on TEE. Continue tegretol.   Suanne Marker, MD 09/15/2012, 3:13 PM Certified in Neurology, Neurophysiology and Neuroimaging [facility] - Stroke Team  Please refer to [email or web address] for on-call Stroke MD

## 2012-09-16 LAB — LIPID PANEL
HDL: 53 mg/dL (ref >39)
LDL Cholesterol: 111 mg/dL — ABNORMAL HIGH (ref 0–99)
Total CHOL/HDL Ratio: 3.5 RATIO

## 2012-09-16 LAB — HEMOGLOBIN A1C: Hgb A1c MFr Bld: 5.4 % (ref <5.7)

## 2012-09-16 NOTE — Evaluation (Signed)
I have read and agree with the below assessment and plan.   Aldan Camey Helen Whitlow PT, DPT Pager: [phone] 

## 2012-09-16 NOTE — Progress Notes (Signed)
[facility] PROGRESS NOTE  Derek Chandler:[id number] DOB: 04/01/1961 DOA: 09/12/2012 PCP: No primary provider on file.  Assessment/Plan: Acute Encephalopathy 2/2 CVA -Per family back to baseline. -EEG negative for seizure activity. -MRI revealed numerous scattered foci of acute infarction in the right temporal lobe and temporoparietal region consistent with either embolic infarctions or right MCA branch vessel infarction. -Will need TEE. Have discussed with [facility] cards to arrange, unfortunately this cannot be done until Tuesday. -ECHO: There is septal hypertrophy with chordal SAM but no significant outflow tract gradient The cavity size was normal. Systolic function was normal. The estimated ejection fraction was in the range of 60% to 65%. Wall motion was normal; there were no regional wall motion abnormalities.  Seizure Disorder -No active seizure activity in the hospital. -Continue Tegretol at home dose.  Mental Retardation  DVT Prophylaxis -Lovenox.  Code Status: Full Family Communication: Sister Britta Mccreedy at bedside   Disposition Plan: Back to group home when ready, suspect not until Wednesday given timing of TEE.  Consultants:  Neurology, Dr. Marjory Lies   Antibiotics:  None   Subjective: Non-verbal. Per family back to baseline mental status  Objective: Filed Vitals:   09/15/12 1700 09/15/12 2200 09/16/12 0200 09/16/12 1035  BP: 98/53 112/67 97/53 110/62  Pulse: 82 71 61 69  Temp: 97.6 F (36.4 C) 97.4 F (36.3 C) 97.6 F (36.4 C) 97.3 F (36.3 C)  TempSrc: Axillary   Axillary  Resp: 20 18 18 20   Height:      Weight:      SpO2: 99% 100% 100% 99%    Intake/Output Summary (Last 24 hours) at 09/16/12 1308 Last data filed at 09/16/12 0825  Gross per 24 hour  Intake    240 ml  Output      0 ml  Net    240 ml   Filed Weights   09/12/12 1240 09/12/12 1900  Weight: 48.535 kg (107 lb) 52.164 kg (115 lb)    Exam:   General:  Nonverbal, does not  follow commands, moving all 4 extremities spontaneously.  Cardiovascular: RRR, no M/R/G   Respiratory: CTA B  Abdomen: S/NT/+BS  Extremities: no C/C/E   Data Reviewed: Basic Metabolic Panel:  Recent Labs Lab 09/11/12 1250 09/12/12 1350 09/12/12 1855 09/13/12 0515  NA 139 139  --  137  K 4.1 3.9  --  3.5  CL 103 104  --  102  CO2 23 24  --  20  GLUCOSE 94 87  --  80  BUN 17 12  --  13  CREATININE 0.51 0.44* 0.46* 0.50  CALCIUM 9.7 9.0  --  8.3*   Liver Function Tests:  Recent Labs Lab 09/11/12 1250 09/13/12 0515  AST 36 38*  ALT 20 22  ALKPHOS 59 48  BILITOT 0.3 0.2*  PROT 7.1 6.0  ALBUMIN 4.3 3.4*   No results found for this basename: LIPASE, AMYLASE,  in the last 168 hours No results found for this basename: AMMONIA,  in the last 168 hours CBC:  Recent Labs Lab 09/11/12 1250 09/12/12 1350 09/12/12 1855 09/13/12 0515  WBC 12.9* 8.5 9.1 7.8  NEUTROABS 9.7* 5.8  --   --   HGB 14.8 13.9 13.7 13.1  HCT 42.4 40.1 39.5 38.9*  MCV 89.5 89.9 90.6 91.5  PLT 232 206 192 194   Cardiac Enzymes: No results found for this basename: CKTOTAL, CKMB, CKMBINDEX, TROPONINI,  in the last 168 hours BNP (last 3 results) No results found for  this basename: PROBNP,  in the last 8760 hours CBG: No results found for this basename: GLUCAP,  in the last 168 hours  Recent Results (from the past 240 hour(s))  URINE CULTURE     Status: None   Collection Time    09/12/12  5:00 PM      Result Value Range Status   Specimen Description URINE, CLEAN CATCH   Final   Special Requests NONE   Final   Culture  Setup Time 09/12/2012 22:01   Final   Colony Count NO GROWTH   Final   Culture NO GROWTH   Final   Report Status 09/13/2012 FINAL   Final     Studies: [name] Contrast  09/14/2012   *RADIOLOGY REPORT*  Clinical Data: Altered mental status.  Stroke or seizure on Tuesday.  MRI HEAD WITHOUT CONTRAST  Technique:  Multiplanar, multiecho pulse sequences of the brain and  surrounding structures were obtained according to standard protocol without intravenous contrast.  Comparison: Head CT 09/12/2012.  Head CT 09/11/2012.  Findings: Diffusion imaging shows multiple foci of acute infarction in the right temporal and temporoparietal region consistent with either embolic disease or right MCA branch vessel occlusion.  There are also old ischemic changes in the right temporo-parietal junction region.  The brainstem and cerebellum are unremarkable.  The cerebral hemispheres otherwise show mild chronic small vessel disease. Ventricular size is stable.  No evidence of hemorrhage or extra- axial collection.  No sign of mass lesion.  Major vessels at the base of the brain show flow.  IMPRESSION: Numerous scattered foci of acute infarction in the right temporal lobe and temporoparietal region consistent with either embolic infarctions or right MCA branch vessel infarction.  No evidence of swelling, hemorrhage or mass effect.   Original Report Authenticated By: Paulina Fusi, M.D.    Scheduled Meds: . carbamazepine  400 mg Oral Custom   And  . carbamazepine  500 mg Oral QHS  . clopidogrel  75 mg Oral Q breakfast  . enoxaparin (LOVENOX) injection  40 mg Subcutaneous Q24H  . finasteride  5 mg Oral Daily  . polyethylene glycol  17 g Oral Daily  . simvastatin  10 mg Oral QHS  . sodium chloride  3 mL Intravenous Q12H  . tamsulosin  0.4 mg Oral Daily   Continuous Infusions: . sodium chloride 75 mL/hr at 09/12/12 2100    Principal Problem:   CVA (cerebral infarction) Active Problems:   Acute encephalopathy   Mental retardation   Seizure disorder    Time spent: 35 minutes.    Chaya Jan  [facility] Pager [phone]  If 7PM-7AM, please contact night-coverage at [email or web address], password [email or web address] 09/16/2012, 1:08 PM  LOS: 4 days

## 2012-09-16 NOTE — Evaluation (Signed)
Physical Therapy Evaluation Patient Details Name: Derek Chandler MRN: [id number] DOB: [birth year] Today's Date: 09/16/2012 Time: 9147-8295 PT Time Calculation (min): 25 min  PT Assessment / Plan / Recommendation Clinical Impression  52 y/o male with medical history of mental retardation seen in [facility] ED because he was not moving the left side, was less responsive, and had developed abnormal movements involving mainly the lower limbs. CT showing acute infarcts in the temporal region. Pt presents to PT today with return to baseline function as reported by family. Pt able to perform bed mobility with modified independence. Pt very cautious with ambulation and required bilateral hand hold from therapist, but has used RW in the past. Acute PT to maximize functional indpendence and saftey for d/c home to assisted living facility.     PT Assessment  Patient needs continued PT services    Follow Up Recommendations  No PT follow up, Supervision/Assistance 24 hr    Recommendations for Other Services OT consult   Frequency Min 3X/week    Precautions / Restrictions Precautions Precautions: Fall Restrictions Weight Bearing Restrictions: No   Pertinent Vitals/Pain Pt denies pain      Mobility  Bed Mobility Bed Mobility: Supine to Sit;Sit to Supine Supine to Sit: 6: Modified independent (Device/Increase time) Sit to Supine: 6: Modified independent (Device/Increase time) Transfers Transfers: Sit to Stand;Stand to Sit Sit to Stand: 5: Supervision Stand to Sit: 5: Supervision Details for Transfer Assistance: supervision for saftey Ambulation/Gait Ambulation/Gait Assistance: 4: Min assist Ambulation Distance (Feet): 150 Feet Assistive device: None Ambulation/Gait Assistance Details: Therapist providing bilateral hand holds for pt.  Gait Pattern: Shuffle;Festinating;Decreased stride length;Step-through pattern Gait velocity: decreased Stairs: No Wheelchair Mobility Wheelchair Mobility:  No Modified Rankin (Stroke Patients Only) Pre-Morbid Rankin Score: Moderately severe disability Modified Rankin: Moderately severe disability        PT Diagnosis: Difficulty walking;Abnormality of gait  PT Problem List: Decreased balance;Decreased mobility PT Treatment Interventions: Gait training;DME instruction;Functional mobility training;Therapeutic activities;Stair training;Balance training;Neuromuscular re-education;Patient/family education;Therapeutic exercise   PT Goals Acute Rehab PT Goals PT Goal Formulation: With patient/family Potential to Achieve Goals: Good Pt will Ambulate: >150 feet;with supervision;with least restrictive assistive device PT Goal: Ambulate - Progress: Goal set today Pt will Go Up / Down Stairs: 3-5 stairs;with supervision;with least restrictive assistive device PT Goal: Up/Down Stairs - Progress: Goal set today  Visit Information  Last PT Received On: 09/16/12 Assistance Needed: +1    Subjective Data  Subjective: pt using non verbal communication  Patient Stated Goal: not stated   Prior Functioning  Home Living Lives With: Alone Type of Home: Assisted living Home Access: Level entry Home Layout: One level Bathroom Shower/Tub: Walk-in shower;Door Bathroom Toilet: Handicapped height Bathroom Accessibility: Yes How Accessible: Accessible via walker;Accessible via wheelchair Home Adaptive Equipment: Built-in shower seat;Walker - rolling;Wheelchair - manual;Grab bars around toilet;Grab bars in shower Prior Function Level of Independence: Needs assistance Needs Assistance: Bathing;Dressing;Toileting;Grooming Bath: Minimal Dressing: Minimal Grooming: Minimal Toileting: Minimal Able to Take Stairs?: Yes Driving: No Communication Communication: Other (comment) (Pt able to respond to yes/no questions with head shakes)    Cognition  Cognition Arousal/Alertness: Awake/alert Behavior During Therapy: WFL for tasks assessed/performed Overall  Cognitive Status: History of cognitive impairments - at baseline    Extremity/Trunk Assessment Right Lower Extremity Assessment RLE ROM/Strength/Tone: WFL for tasks assessed RLE Coordination: WFL - gross motor Left Lower Extremity Assessment LLE ROM/Strength/Tone: WFL for tasks assessed LLE Coordination: WFL - gross motor Trunk Assessment Trunk Assessment: Kyphotic   Balance Balance Balance  Assessed: Yes Static Sitting Balance Static Sitting - Balance Support: Bilateral upper extremity supported;Feet unsupported Static Sitting - Level of Assistance: 6: Modified independent (Device/Increase time) Static Sitting - Comment/# of Minutes: sitting EOB for 2 minutes while donning gown and gait belt High Level Balance High Level Balance Activites: Head turns High Level Balance Comments: With pt holding therapist hands, no LOB with head turns  End of Session PT - End of Session Equipment Utilized During Treatment: Gait belt Activity Tolerance: Patient tolerated treatment well Patient left: in bed;with call bell/phone within reach;with bed alarm set;with family/visitor present Nurse Communication: Mobility status  GP     Payton Doughty 09/16/2012, 9:16 AM Payton Doughty, PT Student

## 2012-09-16 NOTE — Progress Notes (Signed)
Patient's caregiver reported patient having frequent urination with small amounts each time. Bladder scan checked and only showed 71mL. Will continue to monitor.

## 2012-09-16 NOTE — Evaluation (Signed)
Occupational Therapy Evaluation Patient Details Name: Derek Chandler MRN: [id number] DOB: [birth year] Today's Date: 09/16/2012 Time: 0865-7846 OT Time Calculation (min): 17 min  OT Assessment / Plan / Recommendation Clinical Impression   This 52 y.o. Male with h/o mental retardation, who resides in group home admitted due to Lt sided weakness, abnormal movements and mental status changes.  CT shoed acute infarcts in the temporal region.  Accurate assessment difficult due to history of mental retardation and change of environment.  Brother present during eval and feels he is at baseline from a functional mobility and ADL standpoint.  At this time, no further OT needs identified, will sign off.     OT Assessment  Patient does not need any further OT services    Follow Up Recommendations  No OT follow up;Supervision/Assistance - 24 hour    Barriers to Discharge      Equipment Recommendations       Recommendations for Other Services    Frequency       Precautions / Restrictions Precautions Precautions: Fall Restrictions Weight Bearing Restrictions: No       ADL  Eating/Feeding: Modified independent (per brother) Where Assessed - Eating/Feeding: Bed level;Edge of bed Lower Body Dressing: Minimal assistance Where Assessed - Lower Body Dressing: Supported sit to stand Toilet Transfer: Minimal assistance Toilet Transfer Method: Sit to stand;Stand pivot Acupuncturist: Comfort height toilet Toileting - Clothing Manipulation and Hygiene: Minimal assistance Where Assessed - Glass blower/designer Manipulation and Hygiene: Standing Transfers/Ambulation Related to ADLs: Pt ambulates holding onto brother's arm which brother states is normal ADL Comments: Brother feels that hospital has thrown him off a little, but feels he is functioning close to, or at his baseline.  Pt able to don/doff shoes independently.  He is able to pull pants up with min guard assist after toileting    OT  Diagnosis:    OT Problem List:   OT Treatment Interventions:     OT Goals    Visit Information  Last OT Received On: 09/16/12 Assistance Needed: +1    Subjective Data  Subjective: "I think he is pretty much back to normal"  per brother Patient Stated Goal: None stated   Prior Functioning     Home Living Lives With: Other (Comment) (group home) Type of Home: Other (Comment) (group home) Home Access: Level entry Home Layout: One level Bathroom Shower/Tub: Walk-in shower;Door Bathroom Toilet: Handicapped height Bathroom Accessibility: Yes How Accessible: Accessible via walker;Accessible via wheelchair Home Adaptive Equipment: Built-in shower seat;Walker - rolling;Wheelchair - manual;Grab bars around toilet;Grab bars in shower Prior Function Level of Independence: Needs assistance Needs Assistance: Bathing;Dressing;Toileting;Grooming Bath: Moderate Dressing: Minimal Grooming: Moderate Toileting: Minimal Able to Take Stairs?: Yes Driving: No Communication Communication: Other (comment) (Pt able to respond to yes/no questions with head shakes)         Vision/Perception Vision - History Baseline Vision: Wears glasses all the time 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  Brother feels he is close to his baseline, and will watch him back in his normal environment Praxis Praxis: Intact   Cognition  Cognition Arousal/Alertness: Awake/alert Behavior During Therapy: WFL for tasks assessed/performed Overall Cognitive Status: History of cognitive impairments - at baseline    Extremity/Trunk Assessment Right Upper Extremity Assessment RUE ROM/Strength/Tone: Within functional levels  RUE  Coordination: WFL - gross/fine motor Left Upper Extremity Assessment LUE ROM/Strength/Tone: Within functional levels LUE Coordination: WFL - gross/fine motor     Mobility Bed Mobility Bed Mobility: Supine to Sit;Sit to Supine Supine to Sit: 6: Modified independent (Device/Increase time) Sit to Supine: 6: Modified independent (Device/Increase time) Transfers Transfers: Sit to Stand;Stand to Sit Sit to Stand: 5: Supervision Stand to Sit: 5: Supervision     Exercise     Balance     End of Session OT - End of Session Activity Tolerance: Patient tolerated treatment well Patient left: in bed;with call bell/phone within reach;with family/visitor present  GO     Kiley Solimine, Ursula Alert M 09/16/2012, 2:58 PM

## 2012-09-17 ENCOUNTER — Encounter (HOSPITAL_COMMUNITY)
Admission: EM | Disposition: A | Discharge status: Home / Self Care | Payer: Self-pay | Source: Home / Self Care | Attending: Internal Medicine

## 2012-09-17 ENCOUNTER — Encounter (HOSPITAL_COMMUNITY): Payer: Self-pay | Admitting: Gastroenterology

## 2012-09-17 DIAGNOSIS — I421 Obstructive hypertrophic cardiomyopathy: Secondary | ICD-10-CM

## 2012-09-17 DIAGNOSIS — I359 Nonrheumatic aortic valve disorder, unspecified: Secondary | ICD-10-CM

## 2012-09-17 HISTORY — PX: TEE WITHOUT CARDIOVERSION: SHX5443

## 2012-09-17 SURGERY — ECHOCARDIOGRAM, TRANSESOPHAGEAL
Anesthesia: Moderate Sedation

## 2012-09-17 MED ORDER — MIDAZOLAM HCL 10 MG/2ML IJ SOLN
INTRAMUSCULAR | Status: DC | PRN
  Administered 2012-09-17: 2 mg via INTRAVENOUS
  Administered 2012-09-17 (×2): 1 mg via INTRAVENOUS

## 2012-09-17 MED ORDER — CLOPIDOGREL BISULFATE 75 MG PO TABS: 75.0000 mg | ORAL_TABLET | Freq: Every day | ORAL | Status: DC

## 2012-09-17 MED ORDER — BUTAMBEN-TETRACAINE-BENZOCAINE 2-2-14 % EX AERO
INHALATION_SPRAY | CUTANEOUS | Status: DC | PRN
  Administered 2012-09-17: 2 via TOPICAL

## 2012-09-17 MED ORDER — SODIUM CHLORIDE 0.9 % IV SOLN: INTRAVENOUS | Status: DC

## 2012-09-17 MED ORDER — FENTANYL CITRATE 0.05 MG/ML IJ SOLN
INTRAMUSCULAR | Status: DC | PRN
  Administered 2012-09-17: 25 ug via INTRAVENOUS

## 2012-09-17 NOTE — Discharge Summary (Signed)
Physician Discharge Summary  HA PLACERES ZOX:[id number] DOB: November 11, 1960 DOA: 09/12/2012  PCP: No primary provider on file.  Admit date: 09/12/2012 Discharge date: 09/17/2012  Time spent: Greater than 30 minutes  Recommendations for Outpatient Follow-up:  -Advised to follow up with PCP in 2 weeks. -[facility] will call to arrange for an OP event monitor.  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 Patient was given Ativan and was sent home yesterday but as per the caregivers patient has not responded and continues to have involuntary movements. He does have a history of seizures but as per caregivers did not have seizure-like activity. At this time patient will be admitted for further neurologic evaluation. Neurologist was called by the ED physician and they recommended to transfer the patient to [facility] for further evaluation. Patient is unable to provide any history due to his baseline mental retardation.as per caregivers patient has been not sleeping well over the past 2 weeks due to increased frequency of urination. Patient was seen by urologist and was diagnosed with BPH in February and was started on finasteride and tamsulosin.incidentally UA done in the hospital on 09/11/2012 was negative. No history of nausea vomiting or diarrhea no fever, no chest pain shortness of breath . We were  asked to admit him for further evaluation and management.   Hospital Course:   Acute Encephalopathy 2/2 CVA  -Per family back to baseline.  -EEG negative for seizure activity.  -MRI revealed numerous scattered foci of acute infarction in the right temporal lobe and temporoparietal region consistent with either embolic infarctions or right MCA branch vessel infarction.  -TEE negative for source of embolus. -ECHO: There is septal hypertrophy with chordal SAM but no significant outflow tract gradient The cavity size was normal. Systolic function was normal. The estimated ejection fraction was in the range of 60% to 65%. Wall motion was normal; there were no regional wall motion abnormalities. -OP event monitor has been arranged by [facility].  Seizure Disorder  -No active seizure activity in the hospital.  -Continue Tegretol at home dose.   Mental Retardation      Procedures: Carotid Dopplers: Bilateral: mild soft plaque origin ICA. There is no significant ICA stenosis. Right vertebral artery flow is antegrade. Left vertebral artery flow not insonated secondary to constant movement.    Consultations:  Neurology, Dr. Pearlean Brownie  Discharge Instructions  Discharge Orders   Future Orders Complete By Expires     Diet - low sodium heart healthy  As directed     Discontinue IV  As directed     Increase activity slowly  As directed         Medication List    STOP taking these medications       aspirin 81 MG tablet      TAKE these medications       CALCIUM 500 + D PO  Take by mouth  2 (two) times daily.     carbamazepine 200 MG tablet  Commonly known as:  TEGRETOL  Take 400-500 mg by mouth 3 (three) times daily. 400 mg at 7 am, 400 mg at 4 pm, 500 mg at bedtime     clopidogrel 75 MG tablet  Commonly known as:  PLAVIX  Take 1 tablet (75 mg total) by mouth daily with breakfast.     finasteride 5 MG tablet  Commonly known as:  PROSCAR  Take 5 mg by mouth daily.      polyethylene glycol packet  Commonly known as:  MIRALAX / GLYCOLAX  Take 17 g by mouth daily.     simvastatin 10 MG tablet  Commonly known as:  ZOCOR  Take 10 mg by mouth at bedtime.     tamsulosin 0.4 MG Caps  Commonly known as:  FLOMAX  Take 0.4 mg by mouth daily.       Allergies  Allergen Reactions  . Pollen Extract       The results of significant diagnostics from this hospitalization (including imaging, microbiology, ancillary and laboratory) are listed below for reference.    Significant Diagnostic Studies: Ct Head Wo Contrast  09/12/2012   *RADIOLOGY REPORT*  Clinical Data: Altered mental status.  CT HEAD WITHOUT CONTRAST  Technique:  Contiguous axial images were obtained from the base of the skull through the vertex without contrast.  Comparison: 09/11/2012  Findings: Ventricular dilatation is unchanged from previous exam. Focal area of encephalomalacia involving the posterior aspect of the right temporal lobe is unchanged from previous exam.  Stable focal area of encephalomalacia within the left frontal lobe, image 12/series 2.  There is no evidence for acute brain infarct, hemorrhage or mass.  The paranasal sinuses and mastoid air cells are clear.  The skull is intact.  IMPRESSION:  1.  Stable examination. 2.  No change in volume of the lateral ventricles. 3.  Left frontal lobe and right posterior temporal lobe encephalomalacia is unchanged.   Original Report Authenticated By: Signa Kell, M.D.   Ct Head Wo Contrast  09/11/2012   *RADIOLOGY REPORT*  Clinical Data: Change in mental status.  CT HEAD WITHOUT CONTRAST  Technique:  Contiguous axial images were obtained from the base of the skull through the vertex without contrast.  Comparison: None.  Findings: There is slight dilatation of the lateral ventricles.  There is a focal area of encephalomalacia of the posterior aspect  of the right temporal lobe, probably an old infarct. Prominent  sylvian fissure on the right.  Small  focal area of encephalomalacia in the left frontal lobe  adjacent to the interhemispheric falx.   No acute hemorrhage or infarction. No acute osseous abnormality.  IMPRESSION:  Slightly dilated lateral ventricles which I suspect are chronic  given the absence of periventricular edema.   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   Original Report Authenticated By: Francene Boyers, M.D.   Ct Cervical Spine Wo Contrast  09/11/2012   **ADDENDUM** CREATED: 09/11/2012 13:45:34  CERVICAL SPINE CT: There is no fracture or subluxation or prevertebral soft tissue swelling.  No prevertebral soft tissue swelling.   Impression:  No acute abnormality of the cervical spine.  **END ADDENDUM** SIGNED BY: Allayne Gitelman. Jena Gauss, M.D.  09/11/2012   *RADIOLOGY REPORT*  Clinical Data: Possible fall.  Mental retardation.  CT CERVICAL SPINE WITHOUT CONTRAST  Technique:  Multidetector CT imaging of the cervical spine was performed. Multiplanar CT image reconstructions  were also generated.  Comparison: None.  Findings: There is slight dilatation of the lateral ventricles. There is a focal area of encephalomalacia of the posterior aspect of the right temporal lobe, probably an old infarct.  Prominent sylvian fissure on the right.  Small focal area of encephalomalacia in the left frontal lobe adjacent to the interhemispheric falx.  No acute hemorrhage or infarction.  No acute osseous abnormality.  IMPRESSION: Slightly dilated lateral ventricles which I suspect are chronic given the absence of periventricular edema.  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   Original Report Authenticated By: Francene Boyers, M.D.   [name] Contrast  09/14/2012   *RADIOLOGY REPORT*  Clinical Data: Altered mental status.  Stroke or seizure on Tuesday.  MRI HEAD WITHOUT CONTRAST  Technique:  Multiplanar, multiecho pulse sequences of the brain and surrounding structures were obtained  according to standard protocol without intravenous contrast.  Comparison: Head CT 09/12/2012.  Head CT 09/11/2012.  Findings: Diffusion imaging shows multiple foci of acute infarction in the right temporal and temporoparietal region consistent with either embolic disease or right MCA branch vessel occlusion.  There are also old ischemic changes in the right temporo-parietal junction region.  The brainstem and cerebellum are unremarkable.  The cerebral hemispheres otherwise show mild chronic small vessel disease. Ventricular size is stable.  No evidence of hemorrhage or extra- axial collection.  No sign of mass lesion.  Major vessels at the base of the brain show flow.  IMPRESSION: Numerous scattered foci of acute infarction in the right temporal lobe and temporoparietal region consistent with either embolic infarctions or right MCA branch vessel infarction.  No evidence of swelling, hemorrhage or mass effect.   Original Report Authenticated By: Paulina Fusi, M.D.    Microbiology: Recent Results (from the past 240 hour(s))  URINE CULTURE     Status: None   Collection Time    09/12/12  5:00 PM      Result Value Range Status   Specimen Description URINE, CLEAN CATCH   Final   Special Requests NONE   Final   Culture  Setup Time 09/12/2012 22:01   Final   Colony Count NO GROWTH   Final   Culture NO GROWTH   Final   Report Status 09/13/2012 FINAL   Final     Labs: Basic Metabolic Panel:  Recent Labs Lab 09/11/12 1250 09/12/12 1350 09/12/12 1855 09/13/12 0515  NA 139 139  --  137  K 4.1 3.9  --  3.5  CL 103 104  --  102  CO2 23 24  --  20  GLUCOSE 94 87  --  80  BUN 17 12  --  13  CREATININE 0.51 0.44* 0.46* 0.50  CALCIUM 9.7 9.0  --  8.3*   Liver Function Tests:  Recent Labs Lab 09/11/12 1250 09/13/12 0515  AST 36 38*  ALT 20 22  ALKPHOS 59 48  BILITOT 0.3 0.2*  PROT 7.1 6.0  ALBUMIN 4.3 3.4*   No results found for this basename: LIPASE, AMYLASE,  in the last 168 hours No  results found for this basename: AMMONIA,  in the last 168 hours CBC:  Recent Labs Lab 09/11/12 1250 09/12/12 1350 09/12/12 1855 09/13/12 0515  WBC 12.9* 8.5 9.1 7.8  NEUTROABS 9.7* 5.8  --   --   HGB 14.8 13.9 13.7 13.1  HCT 42.4 40.1 39.5 38.9*  MCV 89.5 89.9 90.6 91.5  PLT 232 206  192 194   Cardiac Enzymes: No results found for this basename: CKTOTAL, CKMB, CKMBINDEX, TROPONINI,  in the last 168 hours BNP: BNP (last 3 results) No results found for this basename: PROBNP,  in the last 8760 hours CBG: No results found for this basename: GLUCAP,  in the last 168 hours     Signed:  Chaya Jan  [facility] Pager: [phone] 09/17/2012, 12:38 PM

## 2012-09-17 NOTE — Progress Notes (Signed)
PT Cancellation Note  Patient Details Name: Derek Chandler MRN: [id number] DOB: 23-Apr-1961   Cancelled Treatment:    Reason Eval/Treat Not Completed: Other (comment) (pt due to discharge any minute).  Pt's guardian is in room awaiting the paperwork to leave.     Rollene Rotunda Addelynn Batte, PT, DPT [phone]   09/17/2012, 3:20 PM

## 2012-09-17 NOTE — Interval H&P Note (Signed)
History and Physical Interval Note:  09/17/2012 11:11 AM  Derek Chandler  has presented today for surgery, with the diagnosis of A FIB  The various methods of treatment have been discussed with the patient and family. After consideration of risks, benefits and other options for treatment, the patient has consented to  Procedure(s): TRANSESOPHAGEAL ECHOCARDIOGRAM (TEE) (N/A) as a surgical intervention .  The patient's history has been reviewed, patient examined, no change in status, stable for surgery.  I have reviewed the patient's chart and labs.  Questions were answered to the patient's satisfaction.     Olga Millers

## 2012-09-17 NOTE — Progress Notes (Signed)
SLP Cancellation Note  Patient Details Name: CELIA GIBBONS MRN: [id number] DOB: 1960-11-13   Cancelled treatment:       Reason Eval/Treat Not Completed: Other (comment). Pt preparing for d/c. Brother reports he is at cognitive/communicative baseline. Eval deferred. No f/u needed.    Elsy Chiang, Riley Nearing 09/17/2012, 2:36 PM

## 2012-09-17 NOTE — Progress Notes (Signed)
  Echocardiogram Echocardiogram Transesophageal has been performed.  Bennett Ram, [facility] 09/17/2012, 11:41 AM

## 2012-09-17 NOTE — Progress Notes (Signed)
Patient for d/c back to [facility] today- patient's brother/POA is at bedside- appreciative of all the care. Confirmed plans with group home staff and they will provide transport back.    Reece Levy, MSW, Theresia Majors [phone]

## 2012-09-17 NOTE — Progress Notes (Signed)
Stroke Team Progress Note  HISTORY Derek Chandler is a 52 y.o. male right handed with a past medical history significant for mental retardation, seizures, hypertrophic obstructive cardiomyopathy, transferred to [facility] for further evaluation of declining mental status and abnormal movements.  Patient is unable to communicate but his father said that Derek Chandler takes tegretol and hasn't had a seizure in more than 20 years.  He was evaluated at [facility] ED last 09/11/12 because he was not moving the left side, was less responsive, and had developed abnormal movements involving mainly the lower limbs. CT brain was unremarkable for acute abnormalities and tegretol level was slightly elevated.  His symptoms did not improve and was taken back to [facility] ED 09/12/12..  According to his father, the movements " jerkiness" started just few days ago and he could not pinpoint to a rerqson for this new development.  No new medications or medical illnesses.  Patient was not a TPA candidate secondary to no acute infarct initially noted. He was admitted for further evaluation.  SUBJECTIVE His brother Derek Chandler is at the bedside. He is just back from TEE.    OBJECTIVE Most recent Vital Signs: Filed Vitals:   09/17/12 1140 09/17/12 1145 09/17/12 1150 09/17/12 1155  BP: 112/77 112/77 108/69 108/69  Pulse:      Temp:      TempSrc:      Resp: 20 28 17 28   Height:      Weight:      SpO2: 97% 97% 97% 92%   CBG (last 3)  No results found for this basename: GLUCAP,  in the last 72 hours  IV Fluid Intake:   . sodium chloride 75 mL/hr at 09/12/12 2100    MEDICATIONS  . carbamazepine  400 mg Oral Custom   And  . carbamazepine  500 mg Oral QHS  . clopidogrel  75 mg Oral Q breakfast  . enoxaparin (LOVENOX) injection  40 mg Subcutaneous Q24H  . finasteride  5 mg Oral Daily  . polyethylene glycol  17 g Oral Daily  . simvastatin  10 mg Oral QHS  . sodium chloride  3 mL Intravenous Q12H  . tamsulosin  0.4 mg Oral Daily   PRN:   sodium chloride, sodium chloride  Diet:  Cardiac  Activity:  Out of bed with assistance DVT Prophylaxis:  Lovenox  CLINICALLY SIGNIFICANT STUDIES Basic Metabolic Panel:   Recent Labs Lab 09/12/12 1350 09/12/12 1855 09/13/12 0515  NA 139  --  137  K 3.9  --  3.5  CL 104  --  102  CO2 24  --  20  GLUCOSE 87  --  80  BUN 12  --  13  CREATININE 0.44* 0.46* 0.50  CALCIUM 9.0  --  8.3*   Liver Function Tests:   Recent Labs Lab 09/11/12 1250 09/13/12 0515  AST 36 38*  ALT 20 22  ALKPHOS 59 48  BILITOT 0.3 0.2*  PROT 7.1 6.0  ALBUMIN 4.3 3.4*   CBC:   Recent Labs Lab 09/11/12 1250 09/12/12 1350 09/12/12 1855 09/13/12 0515  WBC 12.9* 8.5 9.1 7.8  NEUTROABS 9.7* 5.8  --   --   HGB 14.8 13.9 13.7 13.1  HCT 42.4 40.1 39.5 38.9*  MCV 89.5 89.9 90.6 91.5  PLT 232 206 192 194   Coagulation: No results found for this basename: LABPROT, INR,  in the last 168 hours Cardiac Enzymes: No results found for this basename: CKTOTAL, CKMB, CKMBINDEX, TROPONINI,  in the  last 168 hours Urinalysis:   Recent Labs Lab 09/11/12 1327 09/12/12 1700  COLORURINE YELLOW AMBER*  LABSPEC 1.028 1.031*  PHURINE 5.0 5.0  GLUCOSEU NEGATIVE NEGATIVE  HGBUR NEGATIVE NEGATIVE  BILIRUBINUR NEGATIVE SMALL*  KETONESUR >80* >80*  PROTEINUR NEGATIVE NEGATIVE  UROBILINOGEN 0.2 0.2  NITRITE NEGATIVE NEGATIVE  LEUKOCYTESUR NEGATIVE NEGATIVE   Lipid Panel     Component Value Date/Time   CHOL 184 09/16/2012 0443   TRIG 98 09/16/2012 0443   HDL 53 09/16/2012 0443   CHOLHDL 3.5 09/16/2012 0443   VLDL 20 09/16/2012 0443   LDLCALC 111* 09/16/2012 0443   HgbA1C  Lab Results  Component Value Date   HGBA1C 5.4 09/16/2012    Urine Drug Screen:   No results found for this basename: labopia,  cocainscrnur,  labbenz,  amphetmu,  thcu,  labbarb    Alcohol Level: No results found for this basename: ETH,  in the last 168 hours  CT of the brain   09/12/12 1. Stable examination. 2. No change in volume  of the lateral ventricles. 3. Left frontal lobe and right posterior temporal lobe encephalomalacia is unchanged. 09/11/12  Slightly dilated lateral ventricles which I suspect are chronic given the absence of periventricular edema. 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  [name] Contrast  09/14/2012 Numerous scattered foci of acute infarction in the right temporal lobe and temporoparietal region consistent with either embolic infarctions or right MCA branch vessel infarction.  No evidence of swelling, hemorrhage or mass effect.    MRA of the brain    2D Echocardiogram  ejection fraction 60-65%. No cardiac source of emboli identified.  TEE Normal LV function; proximal septal thickening; severely thickened MV and subvalvular apparatus with SAM and turbulence in LVOT; cannot R/O oscillating density on MV chordae but more likely chordal thickening; negative saline microcavitation study.Olga Millers  Carotid Doppler Preliminary report: There is no significant ICA stenosis. Vertebral artery flow is antegrade.  CXR    EKG   EEG - 09/13/12 - this is a normal EEG performed with sedation.   Therapy Recommendations no PT or OT needs  Physical Exam  General - 52 year old male sitting crosslegged on the bed rocking back and forth. The patient was uncooperative with the exam and made no attempts to follow any directions. Heart - Regular rate and rhythm - 2/6 systolic murmur. Lungs - Clear to auscultation Skin - Warm and dry. Several small diffuse ecchymotic areas were noted.  NEUROLOGIC:  MENTAL STATUS: awake, alert, the patient made no attempts to communicate or follow commands.  CRANIAL NERVES: Disconjugate eye movements MOTOR: normal bulk and tone, NO FOCAL WEAKNESS. SENSORY: MOVES TO STIM GAIT: STANDS AND WALKS WITH SOME ASSISTANCE.  ASSESSMENT Derek Chandler is a 52 y.o. male presenting with declining mental status and abnormal movements. IV t-PA  not given as an acute infarct was not initially noted. The time last known well could not be determined. An MRI on 09/14/12 showed numerous scattered foci of acute infarction in the right temporal lobe and temporoparietal region consistent with embolic infarctions. No source of emboli identified in the hospital. On aspirin 81 mg orally every day prior to admission. Now on clopidogrel 75 mg orally every day for secondary stroke prevention. Patient with resultant apparent minimal deficits related to his acute infarctions, that have now resolved per his brother.     Seizure disorder  Mental retardation  Hypertrophic obstructive cardiomyopathy  Ptosis history  Dyslipidemia, LDL 111, on Zocor prior to admission, on zocor 10 mg daily now  Hospital day # 5  TREATMENT/PLAN  Continue plavix for secondary stroke prevention  No therapy needs  Seizure history - continue Tegretol   Ongoing risk factor modification Please schedule outpatient telemetry monitoring to assess patient for atrial fibrillation as source of stroke. May be arranged with patient's cardiologist, or cardiologist of choice.   Ok for discharge from stroke standpoint No further inpatient stroke workup indicated. Patient has a 10-15% risk of having another stroke over the next year, the highest risk is within 2 weeks of the most recent stroke/TIA (risk of having a stroke following a stroke or TIA is the same). Stroke Service will sign off. Please call should any needs arise. Follow up with Dr. Pearlean Brownie, Stroke Clinic, in 2 months.  Annie Main, MSN, RN, ANVP-BC, ANP-BC, [facility] Pager: [phone] 09/17/2012 12:28 PM  I have personally obtained a history, examined the patient, evaluated imaging results, and formulated the assessment and plan of care. I agree with the above. Delia Heady, MD

## 2012-09-17 NOTE — CV Procedure (Signed)
See full TEE report in [employer]. Normal LV function; proximal septal thickening; severely thickened MV and subvalvular apparatus with SAM and turbulence in LVOT; cannot R/O oscillating density on MV chordae but more likely chordal thickening; negative saline microcavitation study.Olga Millers

## 2012-09-17 NOTE — Progress Notes (Signed)
Patient d/c instructions and stroke education given to patient's brother. All questions answered. Pt D/C back to group home with no signs of acute distress.

## 2012-09-17 NOTE — H&P (View-Only) (Signed)
Stroke Team Progress Note  HISTORY  Derek Chandler is a 52 y.o. male right handed with a past medical history significant for mental retardation, seizures, hypertrophic obstructive cardiomyopathy, transferred to [facility] for further evaluation of declining mental status and abnormal movements.  Patient is unable to communicate but his father said that Derek Chandler takes tegretol and hasn't had a seizure in more than 20 years.  He was evaluated at [facility] ED last 09/11/12 because he was not moving the left side, was less responsive, and had developed abnormal movements involving mainly the lower limbs. CT brain was unremarkable for acute abnormalities and tegretol level was slightly elevated.  His symptoms did not improve and was taken back to [facility] ED 09/12/12..  According to his father, the movements " jerkiness" started just few days ago and he could not pinpoint to a rerqson for this new development.  No new medications or medical illnesses.   Patient was not a TPA candidate secondary to no acute infarct initially noted. He was admitted for further evaluation.  SUBJECTIVE The patient's brother, who is also his main caregiver, was in the room today. The patient made no attempts to communicate with me; although, he did communicate with his brother using grunts and occasional single words. His brother and I had a long talk regarding the patient. The patient's brother feels he is back to baseline at this time. The patient is anxious to return to his group home.  OBJECTIVE Most recent Vital Signs: Filed Vitals:   09/14/12 2200 09/15/12 0200 09/15/12 0600 09/15/12 0900  BP: 113/65 117/62 127/59 113/50  Pulse: 72 76 83 73  Temp: 97.5 F (36.4 C) 97.5 F (36.4 C) 97.5 F (36.4 C) 97.6 F (36.4 C)  TempSrc:    Axillary  Resp: 20 20 20 18  Height:      Weight:      SpO2: 96% 96% 95% 100%   CBG (last 3)  No results found for this basename: GLUCAP,  in the last 72 hours  IV Fluid Intake:   . sodium chloride  75 mL/hr at 09/12/12 2100    MEDICATIONS  . aspirin EC  81 mg Oral Daily  . carbamazepine  400 mg Oral Custom   And  . carbamazepine  500 mg Oral QHS  . enoxaparin (LOVENOX) injection  40 mg Subcutaneous Q24H  . finasteride  5 mg Oral Daily  . polyethylene glycol  17 g Oral Daily  . simvastatin  10 mg Oral QHS  . sodium chloride  3 mL Intravenous Q12H  . tamsulosin  0.4 mg Oral Daily   PRN:  sodium chloride, sodium chloride  Diet:  Cardiac thin liquids Activity:  Out of bed with assistance DVT Prophylaxis:  Lovenox  CLINICALLY SIGNIFICANT STUDIES Basic Metabolic Panel:  Recent Labs Lab 09/12/12 1350 09/12/12 1855 09/13/12 0515  NA 139  --  137  K 3.9  --  3.5  CL 104  --  102  CO2 24  --  20  GLUCOSE 87  --  80  BUN 12  --  13  CREATININE 0.44* 0.46* 0.50  CALCIUM 9.0  --  8.3*   Liver Function Tests:  Recent Labs Lab 09/11/12 1250 09/13/12 0515  AST 36 38*  ALT 20 22  ALKPHOS 59 48  BILITOT 0.3 0.2*  PROT 7.1 6.0  ALBUMIN 4.3 3.4*   CBC:  Recent Labs Lab 09/11/12 1250 09/12/12 1350 09/12/12 1855 09/13/12 0515  WBC 12.9* 8.5 9.1 7.8    NEUTROABS 9.7* 5.8  --   --   HGB 14.8 13.9 13.7 13.1  HCT 42.4 40.1 39.5 38.9*  MCV 89.5 89.9 90.6 91.5  PLT 232 206 192 194   Coagulation: No results found for this basename: LABPROT, INR,  in the last 168 hours Cardiac Enzymes: No results found for this basename: CKTOTAL, CKMB, CKMBINDEX, TROPONINI,  in the last 168 hours Urinalysis:  Recent Labs Lab 09/11/12 1327 09/12/12 1700  COLORURINE YELLOW AMBER*  LABSPEC 1.028 1.031*  PHURINE 5.0 5.0  GLUCOSEU NEGATIVE NEGATIVE  HGBUR NEGATIVE NEGATIVE  BILIRUBINUR NEGATIVE SMALL*  KETONESUR >80* >80*  PROTEINUR NEGATIVE NEGATIVE  UROBILINOGEN 0.2 0.2  NITRITE NEGATIVE NEGATIVE  LEUKOCYTESUR NEGATIVE NEGATIVE   Lipid Panel No results found for this basename: chol, trig, hdl, cholhdl, vldl, ldlcalc   HgbA1C  No results found for this basename: HGBA1C     Urine Drug Screen:   No results found for this basename: labopia, cocainscrnur, labbenz, amphetmu, thcu, labbarb    Alcohol Level: No results found for this basename: ETH,  in the last 168 hours   CT of the brain  09/11/12 Slightly dilated lateral ventricles which I suspect are chronic given the absence of periventricular edema.  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   CT of the brain  09/12/12 1. Stable examination.  2. No change in volume of the lateral ventricles.  3. Left frontal lobe and right posterior temporal lobe  encephalomalacia is unchanged.   [name] Contrast  09/14/2012 Numerous scattered foci of acute infarction in the right temporal lobe and temporoparietal region consistent with either embolic infarctions or right MCA branch vessel infarction.  No evidence of swelling, hemorrhage or mass effect.     MRA of the brain     2D Echocardiogram  ejection fraction 60-65%. No cardiac source of emboli identified.  TEE - pending  Carotid Doppler Preliminary report: There is no significant ICA stenosis. Vertebral artery flow is antegrade.  CXR    EKG   EEG - 09/13/12 - this is a normal EEG performed with sedation.    Therapy Recommendations - pending  Physical Exam    General - 52-year-old male sitting crosslegged on the bed rocking back and forth. The patient was uncooperative with the exam and made no attempts to follow any directions. Heart - Regular rate and rhythm - 2/6 systolic murmur. Lungs - Clear to auscultation Skin - Warm and dry. Several small diffuse ecchymotic areas were noted.  NEUROLOGIC:   MENTAL STATUS: awake, alert, the patient made no attempts to communicate or follow commands.  CRANIAL NERVES: Disconjugate eye movements MOTOR: normal bulk and tone, NO FOCAL WEAKNESS. SENSORY: MOVES TO STIM GAIT: STANDS AND WALKS WITH SOME ASSISTANCE.  ASSESSMENT Derek Chandler is a 52 y.o. male  presenting with declining mental status and abnormal movements. IV t-PA not given as an acute infarct was not initially noted. The time last known well could not be determined. An MRI on 09/14/12 showed numerous scattered foci of acute infarction in the right temporal lobe and temporoparietal region consistent with either embolic infarctions or right MCA branch vessel infarction. On aspirin 81 mg orally every day prior to admission. Now on aspirin 81 mg orally every day for secondary stroke prevention. Patient with resultant apparent minimal deficits related to his acute infarctions. Work up underway.   Seizure disorder  Mental retardation  Hypertrophic obstructive cardiomyopathy  Ptosis history  Dyslipidemia -   Zocor prior to admission  Hospital day # 3  TREATMENT/PLAN  Change aspirin to plavix 75mg daily  Await therapist evaluations  Seizure history - continue Tegretol PTA  Risk factor modification  Check lipid profile and hemoglobin A1c  Await TEE   David Rinehuls PA-C [facility] Pager [phone] 09/15/2012, 12:03 PM   I have personally obtained a history, examined the patient, evaluated imaging results, and formulated the assessment and plan of care. I agree with the above. Change aspirin to plavix 75mg daily. Follow up on TEE. Continue tegretol.   VIKRAM R. PENUMALLI, MD 09/15/2012, 3:13 PM Certified in Neurology, Neurophysiology and Neuroimaging [facility] - Stroke Team  Please refer to [email or web address] for on-call Stroke MD  

## 2012-09-18 ENCOUNTER — Encounter (HOSPITAL_COMMUNITY): Payer: Self-pay | Admitting: Cardiology

## 2012-09-18 NOTE — Care Management Note (Signed)
    Page 1 of 1   09/18/2012     8:02:15 AM   CARE MANAGEMENT NOTE 09/18/2012  Patient:  Derek Chandler, Derek Chandler   Account Number:  [id number]  Date Initiated:  09/17/2012  Documentation initiated by:  [facility]  Subjective/Objective Assessment:   admitted with AMS from a group home     Action/Plan:   plan return to group home   Anticipated DC Date:  09/17/2012   Anticipated DC Plan:  GROUP HOME  In-house referral  Clinical Social Worker      DC Planning Services  CM consult      Choice offered to / List presented to:             Status of service:  Completed, signed off Medicare Important Message given?   (If response is "NO", the following Medicare IM given date fields will be blank) Date Medicare IM given:   Date Additional Medicare IM given:    Discharge Disposition:  GROUP HOME  Per UR Regulation:  Reviewed for med. necessity/level of care/duration of stay  If discussed at Long Length of Stay Meetings, dates discussed:    Comments:

## 2012-09-23 ENCOUNTER — Telehealth: Payer: Self-pay | Admitting: Cardiology

## 2012-09-23 DIAGNOSIS — I421 Obstructive hypertrophic cardiomyopathy: Secondary | ICD-10-CM

## 2012-09-23 NOTE — Telephone Encounter (Signed)
New Problem:    Called in to see what needed to be done from their end to schedule the patient for an event monitor.  Please call back.

## 2012-09-23 NOTE — Telephone Encounter (Signed)
Follow-up:    Called in returning your call.  Please call back and feel free to speak with the nurse on call.

## 2012-09-23 NOTE — Telephone Encounter (Signed)
Returned call to [employer] at [facility] no answer.LMTC.

## 2012-09-24 NOTE — Telephone Encounter (Signed)
Received call from [facility] at [facility] she stated patient was recently in hospital with a CVA and was told on discharge [facility] would call to schedule event monitor.Schedulers will be calling to schedule event monitor.

## 2012-09-24 NOTE — Telephone Encounter (Signed)
Returned call to [employer] at [facility] no answer.LMTC.

## 2012-09-27 ENCOUNTER — Encounter (INDEPENDENT_AMBULATORY_CARE_PROVIDER_SITE_OTHER): Payer: Medicare Other

## 2012-09-27 ENCOUNTER — Encounter: Payer: Self-pay | Admitting: *Deleted

## 2012-09-27 DIAGNOSIS — I4891 Unspecified atrial fibrillation: Secondary | ICD-10-CM

## 2012-09-27 DIAGNOSIS — I421 Obstructive hypertrophic cardiomyopathy: Secondary | ICD-10-CM

## 2012-09-27 DIAGNOSIS — I6350 Cerebral infarction due to unspecified occlusion or stenosis of unspecified cerebral artery: Secondary | ICD-10-CM

## 2012-09-27 NOTE — Progress Notes (Signed)
Patient ID: Derek Chandler, male   DOB: [birth year], 52 y.o.   MRN: [id number] E-cardio verite 30 day event monitor placed on patient.  Instructions given to father of patient and nurse at group home facility.

## 2012-10-09 ENCOUNTER — Encounter: Payer: Self-pay | Admitting: *Deleted

## 2012-10-09 NOTE — Progress Notes (Signed)
Patient ID: Derek Chandler, male   DOB: 01/15/1961, 52 y.o.   MRN: [id number] Nurse from group home telephoned  and informed us patient chewed  through wires on E-Cardio verite Event monitor that was initially placed on the patient 09/27/12.  Instructed RN Designer, multimedia) to send equipment back to the [employer] in the prepaid box given on 09/27/2012.  E-Cardio and Dr. Illa Level nurse notified.  Not recommended to reapply monitor since patients family will be financially responsible for damaged equipment and RN from group home states it would be highly likely to happen again.

## 2012-10-21 ENCOUNTER — Telehealth: Payer: Self-pay

## 2012-10-21 NOTE — Telephone Encounter (Signed)
Spoke to [employer] at [facility] Dr.Jordan reviewed heart monitor which revealed normal sinus rhythm.Copy of report faxed to fax # [phone].

## 2012-10-24 NOTE — Telephone Encounter (Signed)
Opened in error

## 2013-03-31 ENCOUNTER — Other Ambulatory Visit (HOSPITAL_COMMUNITY): Payer: Self-pay | Admitting: Family Medicine

## 2013-03-31 DIAGNOSIS — R011 Cardiac murmur, unspecified: Secondary | ICD-10-CM

## 2013-04-01 ENCOUNTER — Other Ambulatory Visit (HOSPITAL_COMMUNITY): Payer: Self-pay | Admitting: Family Medicine

## 2013-04-28 ENCOUNTER — Other Ambulatory Visit (HOSPITAL_COMMUNITY): Payer: Self-pay | Admitting: Family Medicine

## 2013-04-28 ENCOUNTER — Ambulatory Visit (HOSPITAL_COMMUNITY)
Admission: RE | Admit: 2013-04-28 | Discharge: 2013-04-28 | Disposition: A | Discharge status: Home / Self Care | Payer: Medicare Other | Source: Ambulatory Visit | Attending: Cardiovascular Disease | Admitting: Cardiovascular Disease

## 2013-04-28 DIAGNOSIS — F79 Unspecified intellectual disabilities: Secondary | ICD-10-CM | POA: Insufficient documentation

## 2013-04-28 DIAGNOSIS — R011 Cardiac murmur, unspecified: Secondary | ICD-10-CM

## 2013-04-28 DIAGNOSIS — Z8673 Personal history of transient ischemic attack (TIA), and cerebral infarction without residual deficits: Secondary | ICD-10-CM | POA: Insufficient documentation

## 2013-04-28 DIAGNOSIS — I517 Cardiomegaly: Secondary | ICD-10-CM | POA: Insufficient documentation

## 2013-04-28 NOTE — Progress Notes (Signed)
2D Echo Performed 04/28/2013    Ramatoulaye Pack, RCS  

## 2014-04-24 ENCOUNTER — Encounter (HOSPITAL_COMMUNITY): Payer: Self-pay | Admitting: *Deleted

## 2014-04-24 ENCOUNTER — Observation Stay (HOSPITAL_COMMUNITY)
Admission: EM | Admit: 2014-04-24 | Discharge: 2014-04-25 | Disposition: A | Discharge status: Home / Self Care | Payer: Medicare Other | Attending: Internal Medicine | Admitting: Internal Medicine

## 2014-04-24 DIAGNOSIS — K5900 Constipation, unspecified: Secondary | ICD-10-CM | POA: Insufficient documentation

## 2014-04-24 DIAGNOSIS — K649 Unspecified hemorrhoids: Secondary | ICD-10-CM | POA: Diagnosis present

## 2014-04-24 DIAGNOSIS — T45525A Adverse effect of antithrombotic drugs, initial encounter: Secondary | ICD-10-CM | POA: Diagnosis not present

## 2014-04-24 DIAGNOSIS — H02409 Unspecified ptosis of unspecified eyelid: Secondary | ICD-10-CM | POA: Diagnosis not present

## 2014-04-24 DIAGNOSIS — F79 Unspecified intellectual disabilities: Secondary | ICD-10-CM | POA: Diagnosis not present

## 2014-04-24 DIAGNOSIS — Z8673 Personal history of transient ischemic attack (TIA), and cerebral infarction without residual deficits: Secondary | ICD-10-CM

## 2014-04-24 DIAGNOSIS — D691 Qualitative platelet defects: Secondary | ICD-10-CM | POA: Insufficient documentation

## 2014-04-24 DIAGNOSIS — K625 Hemorrhage of anus and rectum: Secondary | ICD-10-CM | POA: Diagnosis present

## 2014-04-24 DIAGNOSIS — Z79899 Other long term (current) drug therapy: Secondary | ICD-10-CM | POA: Diagnosis not present

## 2014-04-24 DIAGNOSIS — D72829 Elevated white blood cell count, unspecified: Secondary | ICD-10-CM | POA: Diagnosis present

## 2014-04-24 DIAGNOSIS — R011 Cardiac murmur, unspecified: Secondary | ICD-10-CM | POA: Insufficient documentation

## 2014-04-24 DIAGNOSIS — Z7902 Long term (current) use of antithrombotics/antiplatelets: Secondary | ICD-10-CM | POA: Diagnosis not present

## 2014-04-24 DIAGNOSIS — K623 Rectal prolapse: Secondary | ICD-10-CM | POA: Diagnosis not present

## 2014-04-24 DIAGNOSIS — I421 Obstructive hypertrophic cardiomyopathy: Secondary | ICD-10-CM | POA: Diagnosis not present

## 2014-04-24 DIAGNOSIS — G40909 Epilepsy, unspecified, not intractable, without status epilepticus: Secondary | ICD-10-CM | POA: Diagnosis not present

## 2014-04-24 HISTORY — DX: Hemorrhage of anus and rectum: K62.5

## 2014-04-24 HISTORY — DX: Rectal prolapse: K62.3

## 2014-04-24 HISTORY — DX: Unspecified hemorrhoids: K64.9

## 2014-04-24 LAB — BASIC METABOLIC PANEL
Anion gap: 10 (ref 5–15)
BUN: 23 mg/dL (ref 6–23)
CALCIUM: 8.6 mg/dL (ref 8.4–10.5)
CO2: 22 mmol/L (ref 19–32)
Chloride: 107 mEq/L (ref 96–112)
Creatinine, Ser: 0.57 mg/dL (ref 0.50–1.35)
GFR calc Af Amer: >90 mL/min (ref >90)
GLUCOSE: 96 mg/dL (ref 70–99)
Potassium: 4.2 mmol/L (ref 3.5–5.1)
SODIUM: 139 mmol/L (ref 135–145)

## 2014-04-24 LAB — CBC
HCT: 42.2 % (ref 39.0–52.0)
HEMOGLOBIN: 14 g/dL (ref 13.0–17.0)
MCH: 30.9 pg (ref 26.0–34.0)
MCHC: 33.2 g/dL (ref 30.0–36.0)
MCV: 93.2 fL (ref 78.0–100.0)
Platelets: 235 10*3/uL (ref 150–400)
RBC: 4.53 MIL/uL (ref 4.22–5.81)
RDW: 13.1 % (ref 11.5–15.5)
WBC: 15.1 10*3/uL — ABNORMAL HIGH (ref 4.0–10.5)

## 2014-04-24 MED ORDER — ACETAMINOPHEN 325 MG PO TABS: 650.0000 mg | ORAL_TABLET | Freq: Four times a day (QID) | ORAL | Status: DC | PRN

## 2014-04-24 MED ORDER — ONDANSETRON HCL 4 MG PO TABS: 4.0000 mg | ORAL_TABLET | Freq: Four times a day (QID) | ORAL | Status: DC | PRN

## 2014-04-24 MED ORDER — CARBAMAZEPINE 200 MG PO TABS
400.0000 mg | ORAL_TABLET | Freq: Three times a day (TID) | ORAL | Status: DC
  Administered 2014-04-25 (×2): 400 mg via ORAL
  Filled 2014-04-24 (×4): qty 2

## 2014-04-24 MED ORDER — SODIUM CHLORIDE 0.9 % IV SOLN: INTRAVENOUS | Status: DC

## 2014-04-24 MED ORDER — MIRABEGRON ER 25 MG PO TB24
25.0000 mg | ORAL_TABLET | Freq: Every day | ORAL | Status: DC
  Administered 2014-04-25: 25 mg via ORAL
  Filled 2014-04-24: qty 1

## 2014-04-24 MED ORDER — ACETAMINOPHEN 650 MG RE SUPP: 650.0000 mg | Freq: Four times a day (QID) | RECTAL | Status: DC | PRN

## 2014-04-24 MED ORDER — POLYETHYLENE GLYCOL 3350 17 G PO PACK
17.0000 g | PACK | Freq: Every day | ORAL | Status: DC
  Administered 2014-04-25: 17 g via ORAL
  Filled 2014-04-24: qty 1

## 2014-04-24 MED ORDER — ONDANSETRON HCL 4 MG/2ML IJ SOLN: 4.0000 mg | Freq: Four times a day (QID) | INTRAMUSCULAR | Status: DC | PRN

## 2014-04-24 MED ORDER — SIMVASTATIN 10 MG PO TABS
10.0000 mg | ORAL_TABLET | Freq: Every day | ORAL | Status: DC
  Administered 2014-04-25: 10 mg via ORAL
  Filled 2014-04-24 (×2): qty 1

## 2014-04-24 MED ORDER — TRIAZOLAM 0.25 MG PO TABS: 0.2500 mg | ORAL_TABLET | Freq: Every day | ORAL | Status: DC | PRN

## 2014-04-24 NOTE — ED Notes (Signed)
Attempts x 2 were unsuccessful when trying to place PIV site Patient with severe MR and hx of CVA last year--at baseline per caregivers Patient required multiple staff members at the bedside ED Charge nurse made aware and will attempt PIV placement prior to transferring to floor

## 2014-04-24 NOTE — ED Notes (Signed)
Rectal bleeding while patient in shower at group home, around 1400 Nursing home staff stated that bleeding has since stopped Patient with hx of hemorrhoids with past bleeding due to same Patient with severe MR (non-verbal)---Nursing staff and caregiver on the way to ED, per EMS Patient in NAD upon arrival to ED

## 2014-04-24 NOTE — ED Notes (Signed)
Caregivers x 2 at bedside Patient remains in NAD

## 2014-04-24 NOTE — ED Provider Notes (Signed)
CSN: [id number]     Arrival date & time 04/24/14  1919 History   First MD Initiated Contact with Patient 04/24/14 1936     Chief Complaint  Patient presents with  . Rectal Bleeding     (Consider location/radiation/quality/duration/timing/severity/associated sxs/prior Treatment) HPI Comments: The patient is a 54 year old male with past medical history of mental retardation, non-verbal, hemorrhoids, CVA currently on plavix, seizure disorder, presenting to the emergency from a group home with a chief complaint of rectal bleeding. Patient's nurse reports patient had an episode of constipation today with straining. Patient's nurse reports after bowel movement nursing staff at the patient up to clean him. As they were washing a large amount of bright red blood per rectum was seen in the shower.  Patient had persistent bleeding prior to arrival.  Level V caveat due to severe mental retardation   Patient is a 54 y.o. male presenting with hematochezia. The history is provided by the patient. No language interpreter was used.  Rectal Bleeding   Past Medical History  Diagnosis Date  . Heart murmur   . Mental retardation   . Ptosis   . Seizure disorder   . Hypertrophic obstructive cardiomyopathy    Past Surgical History  Procedure Laterality Date  . Orthopedic surgery    . Right leg surgery with plates and wires    . Hernia repair    . Tee without cardioversion N/A 09/17/2012    Procedure: TRANSESOPHAGEAL ECHOCARDIOGRAM (TEE);  Surgeon: Lewayne Bunting, MD;  Location: [facility] ENDOSCOPY;  Service: Cardiovascular;  Laterality: N/A;   History reviewed. No pertinent family history. History  Substance Use Topics  . Smoking status: Never Smoker   . Smokeless tobacco: Never Used  . Alcohol Use: No    Review of Systems  Unable to perform ROS: Patient nonverbal  Gastrointestinal: Positive for hematochezia.      Allergies  Pollen extract  Home Medications   Prior to Admission medications    Medication Sig Start Date End Date Taking? Authorizing Provider  Calcium Carbonate-Vitamin D (CALCIUM 500 + D PO) Take by mouth 2 (two) times daily.      Historical Provider, MD  carbamazepine (TEGRETOL) 200 MG tablet Take 400-500 mg by mouth 3 (three) times daily. 400 mg at 7 am, 400 mg at 4 pm, 500 mg at bedtime    Historical Provider, MD  clopidogrel (PLAVIX) 75 MG tablet Take 1 tablet (75 mg total) by mouth daily with breakfast. 09/17/12   Henderson Cloud, MD  finasteride (PROSCAR) 5 MG tablet Take 5 mg by mouth daily.    Historical Provider, MD  polyethylene glycol (MIRALAX / GLYCOLAX) packet Take 17 g by mouth daily.    Historical Provider, MD  simvastatin (ZOCOR) 10 MG tablet Take 10 mg by mouth at bedtime.      Historical Provider, MD  tamsulosin (FLOMAX) 0.4 MG CAPS Take 0.4 mg by mouth daily.    Historical Provider, MD   BP 138/70 mmHg  Pulse 98  Temp(Src) 98.7 F (37.1 C) (Axillary)  Resp 20  SpO2 94% Physical Exam  Constitutional: He appears well-developed and well-nourished. No distress.  HENT:  Head: Atraumatic.  Eyes:  Negative for pale conjunctiva  Cardiovascular: Normal rate and regular rhythm.   Murmur heard. Pulmonary/Chest: Effort normal. He has no wheezes. He has no rales.  Abdominal: Soft. There is no tenderness. There is no rebound and no guarding.  Genitourinary: Rectal exam shows mass and tenderness.  Large amount of bright red  blood in adult diaper. Rectal prolapse and several hemorrhoids. Oozing blood per rectum.   Neurological: He is alert.  Skin: Skin is warm and dry. He is not diaphoretic.  Nursing note and vitals reviewed.   ED Course  Procedures (including critical care time) Labs Review Labs Reviewed  CBC - Abnormal; Notable for the following:    WBC 15.1 (*)    All other components within normal limits  BASIC METABOLIC PANEL  OCCULT BLOOD X 1 CARD TO LAB, STOOL    Imaging Review No results found.   EKG Interpretation None       MDM   Final diagnoses:  Rectal bleeding  Rectal prolapse  Hemorrhoids, unspecified hemorrhoid type  Platelet inhibition due to Plavix  Mental retardation   patient with rectal bleeding, prolapsed rectum, hemorrhoids, on Plavix. Patient has large amount of bright red blood in diaper. Plan to admit for serial CBC and further observation of rectal bleeding.  Dr. Denton Lank also evaluated the patient during this encounter. Patient's family and caretakers elect for admission for serial CBCs and further evaluation of rectal bleeding.  2205: Discussed with Dr. Toniann Fail, agrees to admit the patient.    Mellody Drown, PA-C 04/24/14 9562  Suzi Roots, MD 04/25/14 [phone]

## 2014-04-24 NOTE — ED Notes (Addendum)
Occult card Heme +

## 2014-04-24 NOTE — H&P (Signed)
[facility] History and Physical  Derek Chandler OZH:[id number] DOB: 06-11-1960 DOA: 04/24/2014  Referring physician: ER physician. PCP: No primary care provider on file.   Chief Complaint: Rectal bleeding.  History obtained from ER physician and patient's brother.  HPI: Derek Chandler is a 54 y.o. male with history of mental retardation, seizure disorder and previous history of stroke was brought to the ER after patient was found to have bleeding per rectum while patient being cleaned. The patient's diaper was also found to have frank blood. Patient's hemoglobin at this time is stable. On exam by the ER physician and they found hemorrhoids and prolapse of the rectum. At this time patient has been admitted for observation to make sure there is no significant fall in hemoglobin. Patient's brother does not want any active measures unless patient is getting hemodynamically compromised or that is significant fall in hemoglobin. Patient is nonverbal. Patient has not had any nausea vomiting or diarrhea. Patient on Plavix for previous history of stroke.   Review of Systems: As presented in the history of presenting illness, rest negative.  Past Medical History  Diagnosis Date  . Heart murmur   . Mental retardation   . Ptosis   . Seizure disorder   . Hypertrophic obstructive cardiomyopathy    Past Surgical History  Procedure Laterality Date  . Orthopedic surgery    . Right leg surgery with plates and wires    . Hernia repair    . Tee without cardioversion N/A 09/17/2012    Procedure: TRANSESOPHAGEAL ECHOCARDIOGRAM (TEE);  Surgeon: Lewayne Bunting, MD;  Location: [facility] ENDOSCOPY;  Service: Cardiovascular;  Laterality: N/A;   Social History:  reports that he has never smoked. He has never used smokeless tobacco. He reports that he does not drink alcohol. His drug history is not on file. Where does patient live group home. Can patient participate in ADLs? No.  Allergies  Allergen  Reactions  . Pollen Extract     Family History: History reviewed. No pertinent family history.    Prior to Admission medications   Medication Sig Start Date End Date Taking? Authorizing Provider  Calcium Carbonate-Vitamin D (CALCIUM 500 + D PO) Take 1 tablet by mouth 2 (two) times daily.    Yes Historical Provider, MD  carbamazepine (TEGRETOL) 200 MG tablet Take 400 mg by mouth 3 (three) times daily. Take 2 Tablets (400 mg) By Mouth TID.   Yes Historical Provider, MD  clopidogrel (PLAVIX) 75 MG tablet Take 1 tablet (75 mg total) by mouth daily with breakfast. 09/17/12  Yes Estela Isaiah Blakes, MD  mirabegron ER (MYRBETRIQ) 25 MG TB24 tablet Take 25 mg by mouth daily.   Yes Historical Provider, MD  triazolam (HALCION) 0.25 MG tablet Take 0.25 mg by mouth daily as needed (dental care).   Yes Historical Provider, MD  polyethylene glycol (MIRALAX / GLYCOLAX) packet Take 17 g by mouth daily.    Historical Provider, MD  simvastatin (ZOCOR) 10 MG tablet Take 10 mg by mouth at bedtime.      Historical Provider, MD    Physical Exam: Filed Vitals:   04/24/14 1933 04/24/14 2207  BP: 138/70 118/67  Pulse: 98 97  Temp: 98.7 F (37.1 C)   TempSrc: Axillary   Resp: 20 18  SpO2: 94% 96%     General:  Moderately built and nourished.  Eyes: Anicteric no pallor.  ENT: No discharge from the ears eyes nose or mouth.  Neck: No mass felt.  Cardiovascular:  S1 and S2 heard.  Respiratory: No rhonchi or crepitations.  Abdomen: Soft nontender bowel sounds present.  Skin: No rash.  Musculoskeletal: No edema.  Psychiatric: Patient has mental retardation.  Neurologic: Alert awake but patient has mental retardation.  Labs on Admission:  Basic Metabolic Panel:  Recent Labs Lab 04/24/14 1958  NA 139  K 4.2  CL 107  CO2 22  GLUCOSE 96  BUN 23  CREATININE 0.57  CALCIUM 8.6   Liver Function Tests: No results for input(s): AST, ALT, ALKPHOS, BILITOT, PROT, ALBUMIN in the last 168  hours. No results for input(s): LIPASE, AMYLASE in the last 168 hours. No results for input(s): AMMONIA in the last 168 hours. CBC:  Recent Labs Lab 04/24/14 1958  WBC 15.1*  HGB 14.0  HCT 42.2  MCV 93.2  PLT 235   Cardiac Enzymes: No results for input(s): CKTOTAL, CKMB, CKMBINDEX, TROPONINI in the last 168 hours.  BNP (last 3 results) No results for input(s): PROBNP in the last 8760 hours. CBG: No results for input(s): GLUCAP in the last 168 hours.  Radiological Exams on Admission: No results found.   Assessment/Plan Active Problems:   Seizure disorder   Rectal bleeding   Leucocytosis   History of CVA (cerebrovascular accident)   1. Rectal bleeding - patient does have hemorrhoids which could be the source and patient has not had any previous GI workup. At this time patient's brother was also the guardian has requested no active masses except to make sure there is no significant for hemoglobin and if there is significant falls and may have further workup otherwise patient may be discharged back to group home. And wants further workup as outpatient. Holding Plavix for now. 2. Leukocytosis - patient has been afebrile. Patient does not have any cough. Respiratory exam looks benign. Check urinalysis. Follow CBC. 3. History of seizures - on Tegretol. 4. History of CVA - holding Plavix for now secondary to rectal bleeding. 5. History of mental retardation.   DVT Prophylaxis SCDs. Covering Lovenox due to rectal bleeding.  Code Status: DO NOT RESUSCITATE as discussed with patient's brother.  Family Communication: Patient's brother.  Disposition Plan: Admit for observation.    Kelcey Wickstrom N. [facility] Pager [phone].  If 7PM-7AM, please contact night-coverage [email or web address] Password [email or web address] 04/24/2014, 10:31 PM

## 2014-04-24 NOTE — ED Notes (Signed)
Bed: [facility] Expected date:  Expected time:  Means of arrival:  Comments: EMS 54 yo from Group home/rectal bleeding past 5 hours-on blood thinners

## 2014-04-24 NOTE — ED Notes (Signed)
PA at bedside.

## 2014-04-24 NOTE — ED Notes (Signed)
Hospitalist (Dr. Kirtland Bouchard) at bedside

## 2014-04-24 NOTE — ED Notes (Signed)
ED Charge at bedside

## 2014-04-24 NOTE — ED Notes (Addendum)
Occult Blood is Positive.

## 2014-04-24 NOTE — ED Notes (Signed)
Report given to floor All questions answered by this nurse Patient in NAD upon leaving ED  Caregivers x 2 at bedside with patient

## 2014-04-25 ENCOUNTER — Encounter (HOSPITAL_COMMUNITY): Payer: Self-pay | Admitting: Internal Medicine

## 2014-04-25 DIAGNOSIS — K649 Unspecified hemorrhoids: Secondary | ICD-10-CM

## 2014-04-25 DIAGNOSIS — K625 Hemorrhage of anus and rectum: Secondary | ICD-10-CM | POA: Diagnosis not present

## 2014-04-25 DIAGNOSIS — K623 Rectal prolapse: Secondary | ICD-10-CM | POA: Diagnosis present

## 2014-04-25 HISTORY — DX: Rectal prolapse: K62.3

## 2014-04-25 HISTORY — DX: Unspecified hemorrhoids: K64.9

## 2014-04-25 LAB — COMPREHENSIVE METABOLIC PANEL
ALK PHOS: 67 U/L (ref 39–117)
ALT: 20 U/L (ref 0–53)
ANION GAP: 9 (ref 5–15)
AST: 31 U/L (ref 0–37)
Albumin: 4.5 g/dL (ref 3.5–5.2)
BILIRUBIN TOTAL: 0.6 mg/dL (ref 0.3–1.2)
BUN: 16 mg/dL (ref 6–23)
CALCIUM: 8.9 mg/dL (ref 8.4–10.5)
CHLORIDE: 107 meq/L (ref 96–112)
CO2: 24 mmol/L (ref 19–32)
Creatinine, Ser: 0.55 mg/dL (ref 0.50–1.35)
Glucose, Bld: 91 mg/dL (ref 70–99)
Potassium: 4.4 mmol/L (ref 3.5–5.1)
Sodium: 140 mmol/L (ref 135–145)
Total Protein: 7.1 g/dL (ref 6.0–8.3)

## 2014-04-25 LAB — TYPE AND SCREEN
ABO/RH(D): A NEG
Antibody Screen: NEGATIVE

## 2014-04-25 LAB — URINALYSIS, ROUTINE W REFLEX MICROSCOPIC
Bilirubin Urine: NEGATIVE
GLUCOSE, UA: NEGATIVE mg/dL
Hgb urine dipstick: NEGATIVE
KETONES UR: 15 mg/dL — AB
Leukocytes, UA: NEGATIVE
Nitrite: NEGATIVE
PH: 7 (ref 5.0–8.0)
Protein, ur: NEGATIVE mg/dL
SPECIFIC GRAVITY, URINE: 1.026 (ref 1.005–1.030)
Urobilinogen, UA: 0.2 mg/dL (ref 0.0–1.0)

## 2014-04-25 LAB — CBC
HCT: 42.9 % (ref 39.0–52.0)
HCT: 43.1 % (ref 39.0–52.0)
Hemoglobin: 14.2 g/dL (ref 13.0–17.0)
Hemoglobin: 14.3 g/dL (ref 13.0–17.0)
MCH: 30.9 pg (ref 26.0–34.0)
MCH: 31 pg (ref 26.0–34.0)
MCHC: 33.1 g/dL (ref 30.0–36.0)
MCHC: 33.2 g/dL (ref 30.0–36.0)
MCV: 93.3 fL (ref 78.0–100.0)
MCV: 93.3 fL (ref 78.0–100.0)
PLATELETS: 217 10*3/uL (ref 150–400)
Platelets: 232 10*3/uL (ref 150–400)
RBC: 4.6 MIL/uL (ref 4.22–5.81)
RBC: 4.62 MIL/uL (ref 4.22–5.81)
RDW: 13.1 % (ref 11.5–15.5)
RDW: 13.1 % (ref 11.5–15.5)
WBC: 12.6 10*3/uL — ABNORMAL HIGH (ref 4.0–10.5)
WBC: 12 10*3/uL — AB (ref 4.0–10.5)

## 2014-04-25 LAB — ABO/RH: ABO/RH(D): A NEG

## 2014-04-25 LAB — MRSA PCR SCREENING: MRSA by PCR: INVALID — AB

## 2014-04-25 MED ORDER — HYDROCORTISONE ACETATE 25 MG RE SUPP: 25.0000 mg | Freq: Two times a day (BID) | RECTAL | Status: AC

## 2014-04-25 MED ORDER — HYDROCORTISONE ACE-PRAMOXINE 2.5-1 % RE CREA
TOPICAL_CREAM | Freq: Four times a day (QID) | RECTAL | Status: DC
  Filled 2014-04-25: qty 30

## 2014-04-25 MED ORDER — PRAMOXINE HCL 1 % RE FOAM: 1.0000 "application " | Freq: Three times a day (TID) | RECTAL | Status: AC | PRN

## 2014-04-25 MED ORDER — HYDROCORTISONE ACE-PRAMOXINE 1-1 % RE CREA
TOPICAL_CREAM | Freq: Four times a day (QID) | RECTAL | Status: DC
  Filled 2014-04-25: qty 30

## 2014-04-25 MED ORDER — CLOPIDOGREL BISULFATE 75 MG PO TABS: 75.0000 mg | ORAL_TABLET | Freq: Every day | ORAL | Status: AC

## 2014-04-25 NOTE — Discharge Summary (Signed)
Physician Discharge Summary  Derek Chandler:[id number] DOB: 10-01-1960 DOA: 04/24/2014  PCP: Dr. Karl Bales  Admit date: 04/24/2014 Discharge date: 04/25/2014   Recommendations for Outpatient Follow-Up:   1. F/U with PCP for worsening symptoms or recurrent rectal bleeding. 2. Resume Plavix on 05/01/14.   Discharge Diagnosis:   Principal Problem:    Rectal bleeding Active Problems:    Seizure disorder    Leucocytosis    History of CVA (cerebrovascular accident)    Rectal prolapse    Hemorrhoids   Discharge Condition: Improved.  Diet recommendation:  Regular.   History of Present Illness:   Derek Chandler is an 54 y.o. male with a PMH of mental retardation, seizure disorder, prior history of stroke on chronic Plavix, who was admitted 04/24/14 when his caregivers noted blood in his diaper. Hemoglobin was stable on admission. On exam, the patient was found to have hemorrhoids and prolapse of the rectum. Family wishes conservative care.  Hospital Course by Problem:   Principal Problem:  Rectal bleeding in the setting of rectal prolapse and hemorrhoids on Plavix therapy  Plavix on hold.  Continue to hold for a total of 7 days.  Add ProctoCream.  Hemoglobin stable.  Active Problems:  Seizure disorder  Continue Tegretol.   Leucocytosis  WBC trending down.   History of CVA (cerebrovascular accident)  Continue statin. Plavix on hold.    Medical Consultants:    None.   Discharge Exam:   Filed Vitals:   04/25/14 0517  BP: 107/51  Pulse: 84  Temp: 98.7 F (37.1 C)  Resp: 18   Filed Vitals:   04/24/14 1933 04/24/14 2207 04/24/14 2323 04/25/14 0517  BP: 138/70 118/67  107/51  Pulse: 98 97  84  Temp: 98.7 F (37.1 C)   98.7 F (37.1 C)  TempSrc: Axillary   Oral  Resp: Height:    (1.575 m)   Weight:   44.9 kg (98 lb 15.8 oz)   SpO2: 94% 96%  97%    Gen:  NAD Cardiovascular:  RRR, No M/R/G Respiratory: Lungs  CTAB Gastrointestinal: Abdomen soft, NT/ND with normal active bowel sounds. Extremities: No C/E/C   The results of significant diagnostics from this hospitalization (including imaging, microbiology, ancillary and laboratory) are listed below for reference.     Procedures and Diagnostic Studies:   None.   Labs:   Basic Metabolic Panel:  Recent Labs Lab 04/24/14 1958 04/25/14 0615  NA 139 140  K 4.2 4.4  CL 107 107  CO2 22 24  GLUCOSE 96 91  BUN 23 16  CREATININE 0.57 0.55  CALCIUM 8.6 8.9   GFR Estimated Creatinine Clearance: 67 mL/min (by C-G formula based on Cr of 0.55). Liver Function Tests:  Recent Labs Lab 04/25/14 0615  AST 31  ALT 20  ALKPHOS 67  BILITOT 0.6  PROT 7.1  ALBUMIN 4.5   CBC:  Recent Labs Lab 04/24/14 1958 04/24/14 2319 04/25/14 0615  WBC 15.1* 12.6* 12.0*  HGB 14.0 14.2 14.3  HCT 42.2 42.9 43.1  MCV 93.2 93.3 93.3  PLT 235 232 217   Microbiology Recent Results (from the past 240 hour(s))  MRSA PCR Screening     Status: Abnormal   Collection Time: 04/25/14 12:05 AM  Result Value Ref Range Status   MRSA by PCR INVALID RESULTS, SPECIMEN SENT FOR CULTURE (A) NEGATIVE Final    Comment: SPOKE WITH MALMFELT,J RN [phone] COVINGTON,N  The GeneXpert MRSA Assay (FDA approved for NASAL specimens only), is one component of a comprehensive MRSA colonization surveillance program. It is not intended to diagnose MRSA infection nor to guide or monitor treatment for MRSA infections.      Discharge Instructions:   Discharge Instructions    Call MD for:    Complete by:  As directed   Recurrent rectal bleeding.     Diet general    Complete by:  As directed      Increase activity slowly    Complete by:  As directed             Medication List    TAKE these medications        CALCIUM 500 + D PO  Take 1 tablet by mouth 2 (two) times daily.     carbamazepine 200 MG tablet  Commonly known as:  TEGRETOL  Take 400 mg  by mouth 3 (three) times daily. Take 2 Tablets (400 mg) By Mouth TID.     clopidogrel 75 MG tablet  Commonly known as:  PLAVIX  Take 1 tablet (75 mg total) by mouth daily with breakfast. Hold this medicine for 6 more days.  Resume 05/01/14.     hydrocortisone 25 MG suppository  Commonly known as:  ANUSOL-HC  Place 1 suppository (25 mg total) rectally 2 (two) times daily.     mirabegron ER 25 MG Tb24 tablet  Commonly known as:  MYRBETRIQ  Take 25 mg by mouth daily.     polyethylene glycol packet  Commonly known as:  MIRALAX / GLYCOLAX  Take 17 g by mouth daily.     pramoxine 1 % foam  Commonly known as:  PROCTOFOAM  Place 1 application rectally 3 (three) times daily as needed for itching.     simvastatin 10 MG tablet  Commonly known as:  ZOCOR  Take 10 mg by mouth at bedtime.     triazolam 0.25 MG tablet  Commonly known as:  HALCION  Take 0.25 mg by mouth daily as needed (dental care).           Follow-up Information    Follow up with Dr. Karl Bales.   Why:  If symptoms worsen       Time coordinating discharge: 25 minutes.  Signed:  RAMA,CHRISTINA  Pager [phone] [facility] 04/25/2014, 9:37 AM

## 2014-04-25 NOTE — Progress Notes (Signed)
Patient with brother, legal guardian, at bedside.  Discharge instructions given, brother verbalized understanding of new meds, f/u with PCP.  Bjorn Loser, RN from pt's group home called as well with discharge instructions.  SW faxed discharge paperwork to group home.  Belongings packed, pt d/c to group home via wheelchair.

## 2014-04-25 NOTE — Progress Notes (Signed)
UR completed 

## 2014-04-25 NOTE — Discharge Instructions (Signed)

## 2014-04-26 NOTE — Clinical Social Work Note (Signed)
CSW received a call that pt was being discharged  CSW called and spoke with pt's group home nurse at [facility] who stated that they did not need pt's FL2 only a faxed discharge summary.  CSW faxed discharge summary to [phone] and provided nurse with phone number to call report  CSW met with pt's brother at bedside who stated that he would be taking pt home with him for the weekend and then he would get him back to group home on Monday  No further CSW needs  .Dede Query, LCSW [facility] Clinical Social Worker - Weekend Coverage cell #: [phone]

## 2014-04-27 LAB — MRSA CULTURE

## 2014-04-27 LAB — OCCULT BLOOD, POC DEVICE: FECAL OCCULT BLD: POSITIVE — AB

## 2016-02-09 ENCOUNTER — Emergency Department (HOSPITAL_COMMUNITY): Payer: Medicare Other

## 2016-02-09 ENCOUNTER — Emergency Department (HOSPITAL_COMMUNITY)
Admission: EM | Admit: 2016-02-09 | Discharge: 2016-02-09 | Disposition: A | Discharge status: Home / Self Care | Payer: Medicare Other | Attending: Emergency Medicine | Admitting: Emergency Medicine

## 2016-02-09 ENCOUNTER — Encounter (HOSPITAL_COMMUNITY): Payer: Self-pay | Admitting: *Deleted

## 2016-02-09 DIAGNOSIS — M79605 Pain in left leg: Secondary | ICD-10-CM | POA: Diagnosis not present

## 2016-02-09 DIAGNOSIS — Z8673 Personal history of transient ischemic attack (TIA), and cerebral infarction without residual deficits: Secondary | ICD-10-CM | POA: Diagnosis not present

## 2016-02-09 MED ORDER — ACETAMINOPHEN 500 MG PO TABS: 500.0000 mg | ORAL_TABLET | Freq: Four times a day (QID) | ORAL | 0 refills | Status: AC | PRN

## 2016-02-09 NOTE — ED Provider Notes (Signed)
[facility]-EMERGENCY DEPT Provider Note   CSN: [id number] Arrival date & time: 02/09/16  1716  By signing my name below, I, Modena Jansky, attest that this documentation has been prepared under the direction and in the presence of non-physician practitioner, Fayrene Helper, PA-C. Electronically Signed: Modena Jansky, Scribe. 02/09/2016. 7:50 PM.  History   Chief Complaint Chief Complaint  Patient presents with  . Leg Pain   The history is provided by a relative. No language interpreter was used.   HPI Comments: KEETON KASSEBAUM is a 55 y.o. male who presents to the Emergency Department complaining of constant moderate LLE pain that started today. Pt has hx of mental retardation and is nonverbal baseline. Pt lives at a group home, care giver and brother is in the room.  Brother states that due to pain, pt is unable to ambulate. Ambulation exacerbates the pain and there are no alleviating factors. He reports that pt is usually able to ambulate with a walker. Care giver have not noticed any fever, vomiting, or decreased appetite for pt.   Past Medical History:  Diagnosis Date  . Heart murmur   . Hemorrhoids 04/25/2014  . Hypertr obst cardiomyop   . Mental retardation   . Ptosis   . Rectal bleeding 04/24/2014  . Rectal prolapse 04/25/2014  . Seizure disorder [facility])     Patient Active Problem List   Diagnosis Date Noted  . Rectal prolapse 04/25/2014  . Hemorrhoids 04/25/2014  . Rectal bleeding 04/24/2014  . Leucocytosis 04/24/2014  . History of CVA (cerebrovascular accident) 04/24/2014  . CVA (cerebral infarction) 09/15/2012  . Acute encephalopathy 09/13/2012  . Hypertr obst cardiomyop   . Mental retardation   . Seizure disorder [facility])     Past Surgical History:  Procedure Laterality Date  . HERNIA REPAIR    . ORTHOPEDIC SURGERY    . RIGHT LEG SURGERY WITH PLATES AND WIRES    . TEE WITHOUT CARDIOVERSION N/A 09/17/2012   Procedure: TRANSESOPHAGEAL ECHOCARDIOGRAM (TEE);  Surgeon: Lewayne Bunting, MD;  Location: [facility] ENDOSCOPY;  Service: Cardiovascular;  Laterality: N/A;       Home Medications    Prior to Admission medications   Medication Sig Start Date End Date Taking? Authorizing Provider  Calcium Carbonate-Vitamin D (CALCIUM 500 + D PO) Take 1 tablet by mouth 2 (two) times daily.     Historical Provider, MD  carbamazepine (TEGRETOL) 200 MG tablet Take 400 mg by mouth 3 (three) times daily. Take 2 Tablets (400 mg) By Mouth TID.    Historical Provider, MD  clopidogrel (PLAVIX) 75 MG tablet Take 1 tablet (75 mg total) by mouth daily with breakfast. Hold this medicine for 6 more days.  Resume 05/01/14. 04/25/14   Maryruth Bun Rama, MD  hydrocortisone (ANUSOL-HC) 25 MG suppository Place 1 suppository (25 mg total) rectally 2 (two) times daily. 04/25/14   Maryruth Bun Rama, MD  mirabegron ER (MYRBETRIQ) 25 MG TB24 tablet Take 25 mg by mouth daily.    Historical Provider, MD  polyethylene glycol (MIRALAX / GLYCOLAX) packet Take 17 g by mouth daily.    Historical Provider, MD  pramoxine (PROCTOFOAM) 1 % foam Place 1 application rectally 3 (three) times daily as needed for itching. 04/25/14   Maryruth Bun Rama, MD  simvastatin (ZOCOR) 10 MG tablet Take 10 mg by mouth at bedtime.      Historical Provider, MD  triazolam (HALCION) 0.25 MG tablet Take 0.25 mg by mouth daily as needed (dental care).    Historical  Provider, MD    Family History No family history on file.  Social History Social History  Substance Use Topics  . Smoking status: Never Smoker  . Smokeless tobacco: Never Used  . Alcohol use No     Allergies   Pollen extract   Review of Systems Review of Systems  Unable to perform ROS: Patient nonverbal  Constitutional: Negative for fever.  Musculoskeletal: Positive for gait problem and myalgias (LLE pain).     Physical Exam Updated Vital Signs BP 116/56 (BP Location: Left Arm)   Pulse 78   Resp 18   SpO2 99%   Physical Exam  Constitutional: He appears  well-developed and well-nourished. No distress.  Pt sitting in wheel chair, in no acute discomfort.  Non toxic  HENT:  Head: Normocephalic and atraumatic.  Eyes: Conjunctivae are normal.  Neck: Neck supple.  Cardiovascular: Normal rate.   Pulmonary/Chest: Effort normal.  Abdominal: Soft.  Musculoskeletal: Normal range of motion.  Entire spine is non tender. Left hip, femur, and knee are non tender. Pt withdraws with palpation to left foot area.   Neurological: He is alert.  Skin: Skin is warm and dry.  Psychiatric: He has a normal mood and affect.  Nursing note and vitals reviewed.    ED Treatments / Results  DIAGNOSTIC STUDIES: Oxygen Saturation is 99% on RA, normal by my interpretation.    COORDINATION OF CARE: 7:54 PM- Pt advised of plan for treatment and pt agrees.  Labs (all labs ordered are listed, but only abnormal results are displayed) Labs Reviewed - No data to display  EKG  EKG Interpretation None       Radiology Dg Foot Complete Left  Result Date: 02/09/2016 CLINICAL DATA:  Left foot pain.  Unable to ambulate. EXAM: LEFT FOOT - COMPLETE 3+ VIEW COMPARISON:  None. FINDINGS: There is pes planus with hindfoot valgus. No acute fracture. Slight splaying of the second and third toes. No soft tissue mass or ulceration. On the lateral projection, the ankle appears to be internally rotated with foot eversion. Toes are held in flexion at the MCP articulations. Assessment of the hindfoot limited due to overlap on the lateral view. IMPRESSION: Osteopenia. Hindfoot valgus with foot eversion. No acute osseous abnormality identified. Electronically Signed   By: Tollie Eth M.D.   On: 02/09/2016 21:20   Dg Hip Unilat W Or Wo Pelvis 2-3 Views Left  Result Date: 02/09/2016 CLINICAL DATA:  Left hip pain EXAM: DG HIP (WITH OR WITHOUT PELVIS) 2-3V LEFT COMPARISON:  None. FINDINGS: There is no evidence of hip fracture or dislocation. There is no evidence of arthropathy or other  focal bone abnormality. IMPRESSION: Negative. Electronically Signed   By: Paulina Fusi M.D.   On: 02/09/2016 19:29    Procedures Procedures (including critical care time)  Medications Ordered in ED Medications - No data to display   Initial Impression / Assessment and Plan / ED Course  I have reviewed the triage vital signs and the nursing notes.  Pertinent labs & imaging results that were available during my care of the patient were reviewed by me and considered in my medical decision making (see chart for details).  Clinical Course   BP 116/56 (BP Location: Left Arm)   Pulse 78   Resp 18   SpO2 99%   Patient X-Ray negative for obvious fracture or dislocation. Pain managed in ED. No constitutional sxs to suggest infectious etiology.  Pt currently on anticoagulant, no swelling doubt DVT.  Pt is  NVI. Pt advised to follow up with orthopedics if symptoms persist for possibility of missed fracture diagnosis.  conservative therapy recommended and discussed. Patient will be dc home & brother along with caregiver is agreeable with above plan.   Final Clinical Impressions(s) / ED Diagnoses   Final diagnoses:  Left leg pain    New Prescriptions New Prescriptions   ACETAMINOPHEN (TYLENOL) 500 MG TABLET    Take 1 tablet (500 mg total) by mouth every 6 (six) hours as needed.   I personally performed the services described in this documentation, which was scribed in my presence. The recorded information has been reviewed and is accurate.       Fayrene HelperBowie Devynn Scheff, PA-C 02/09/16 2135    Nira ConnPedro Eduardo Cardama, MD 09/29/2015 [phone]

## 2016-02-09 NOTE — ED Triage Notes (Addendum)
Patient presents with his group home staff with a potential complaint of right leg pain.  Patient is not verbal, but when asked to point to where he hurts, he points left upper femur.  Staff states patient is generally walks some, but is using a wheelchair.  Today he has been reluctant to walk to his shower or get on the van and staff states this is unusual.  Staff states over last day patient has been limping on left leg.  Staff states patient has not fallen that they know of.

## 2016-02-23 DEATH — deceased

## 2018-05-04 IMAGING — CR DG FOOT COMPLETE 3+V*L*
3 series · 3 of 3 positions shown · non-contrast
Comparison: None.

CLINICAL DATA: Left foot pain.  Unable to ambulate.

EXAM:
LEFT FOOT - COMPLETE 3+ VIEW

[x foot ap left]
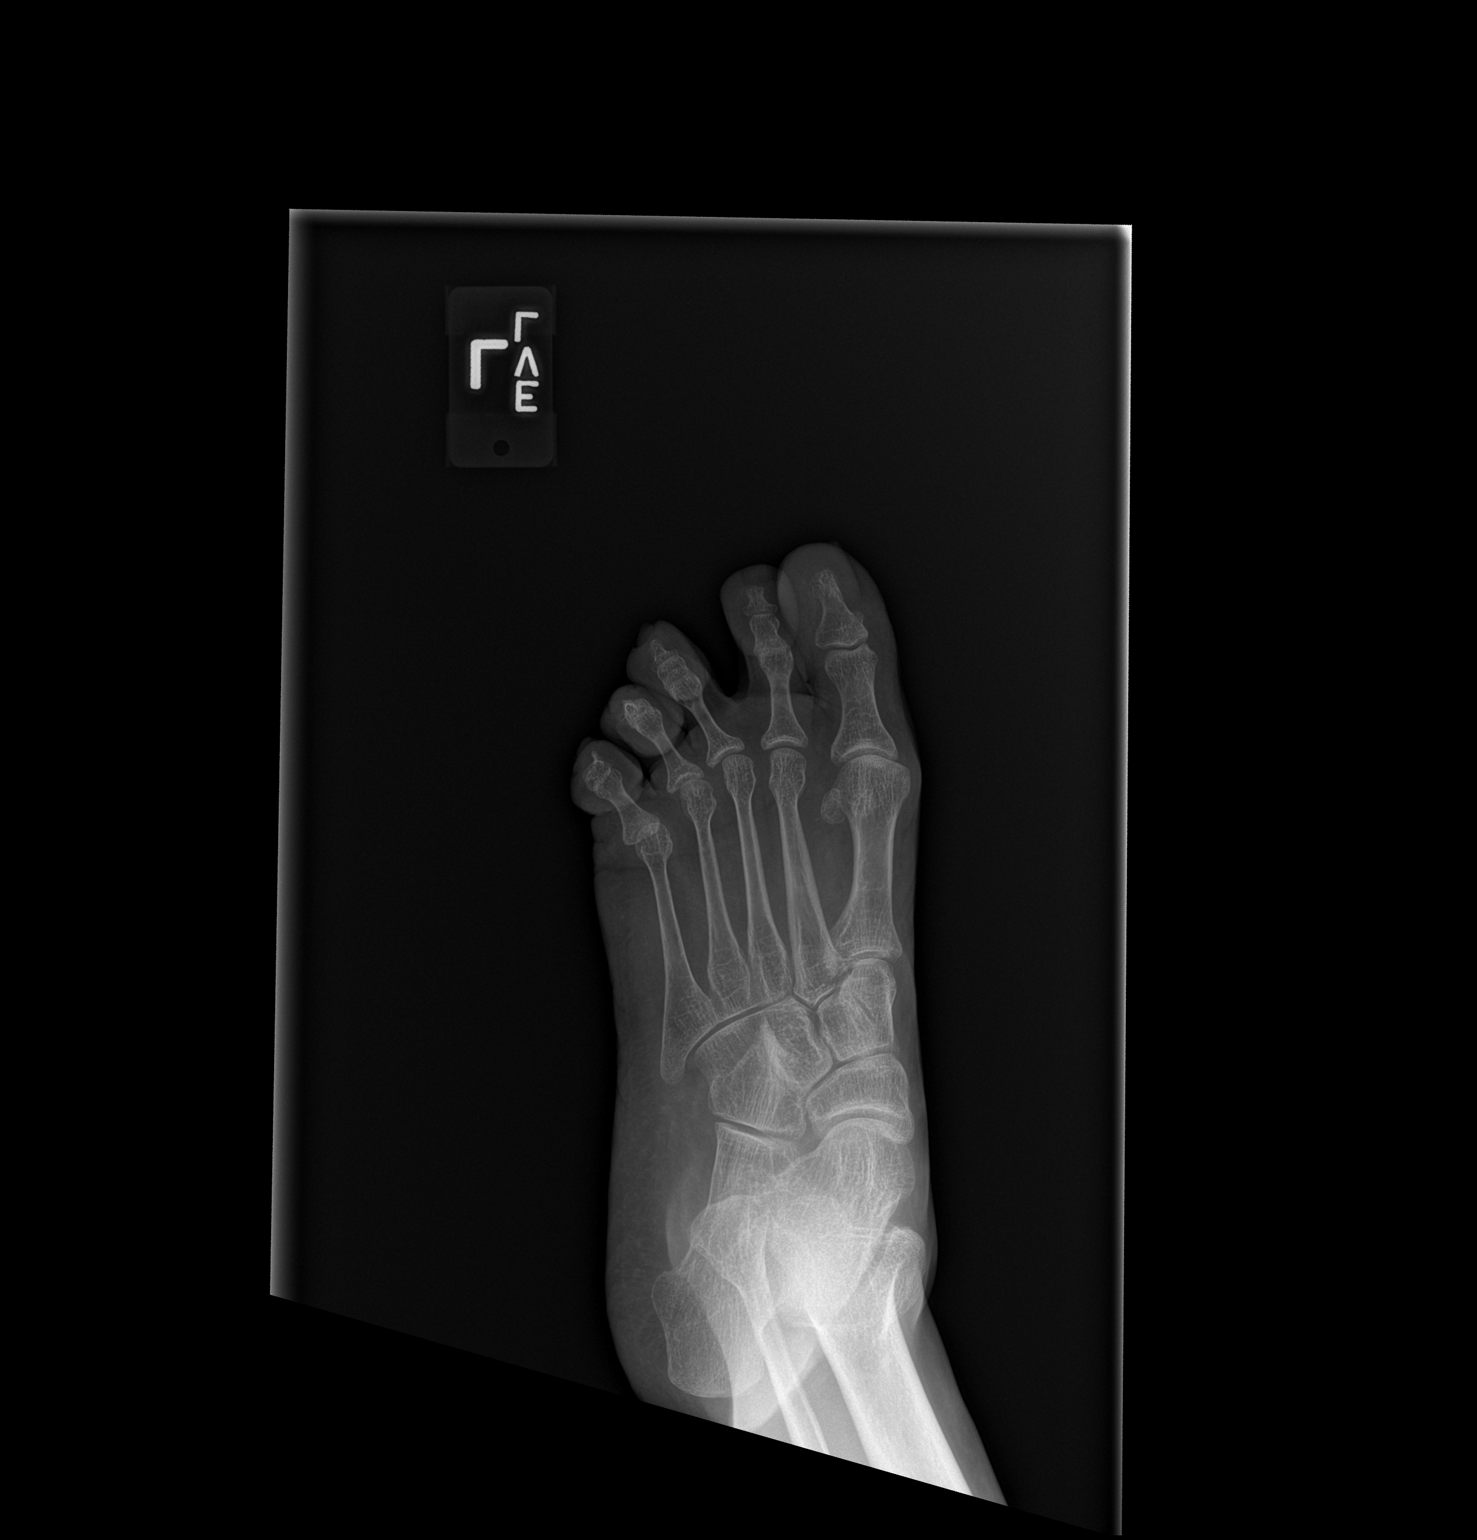

[x foot obl left]
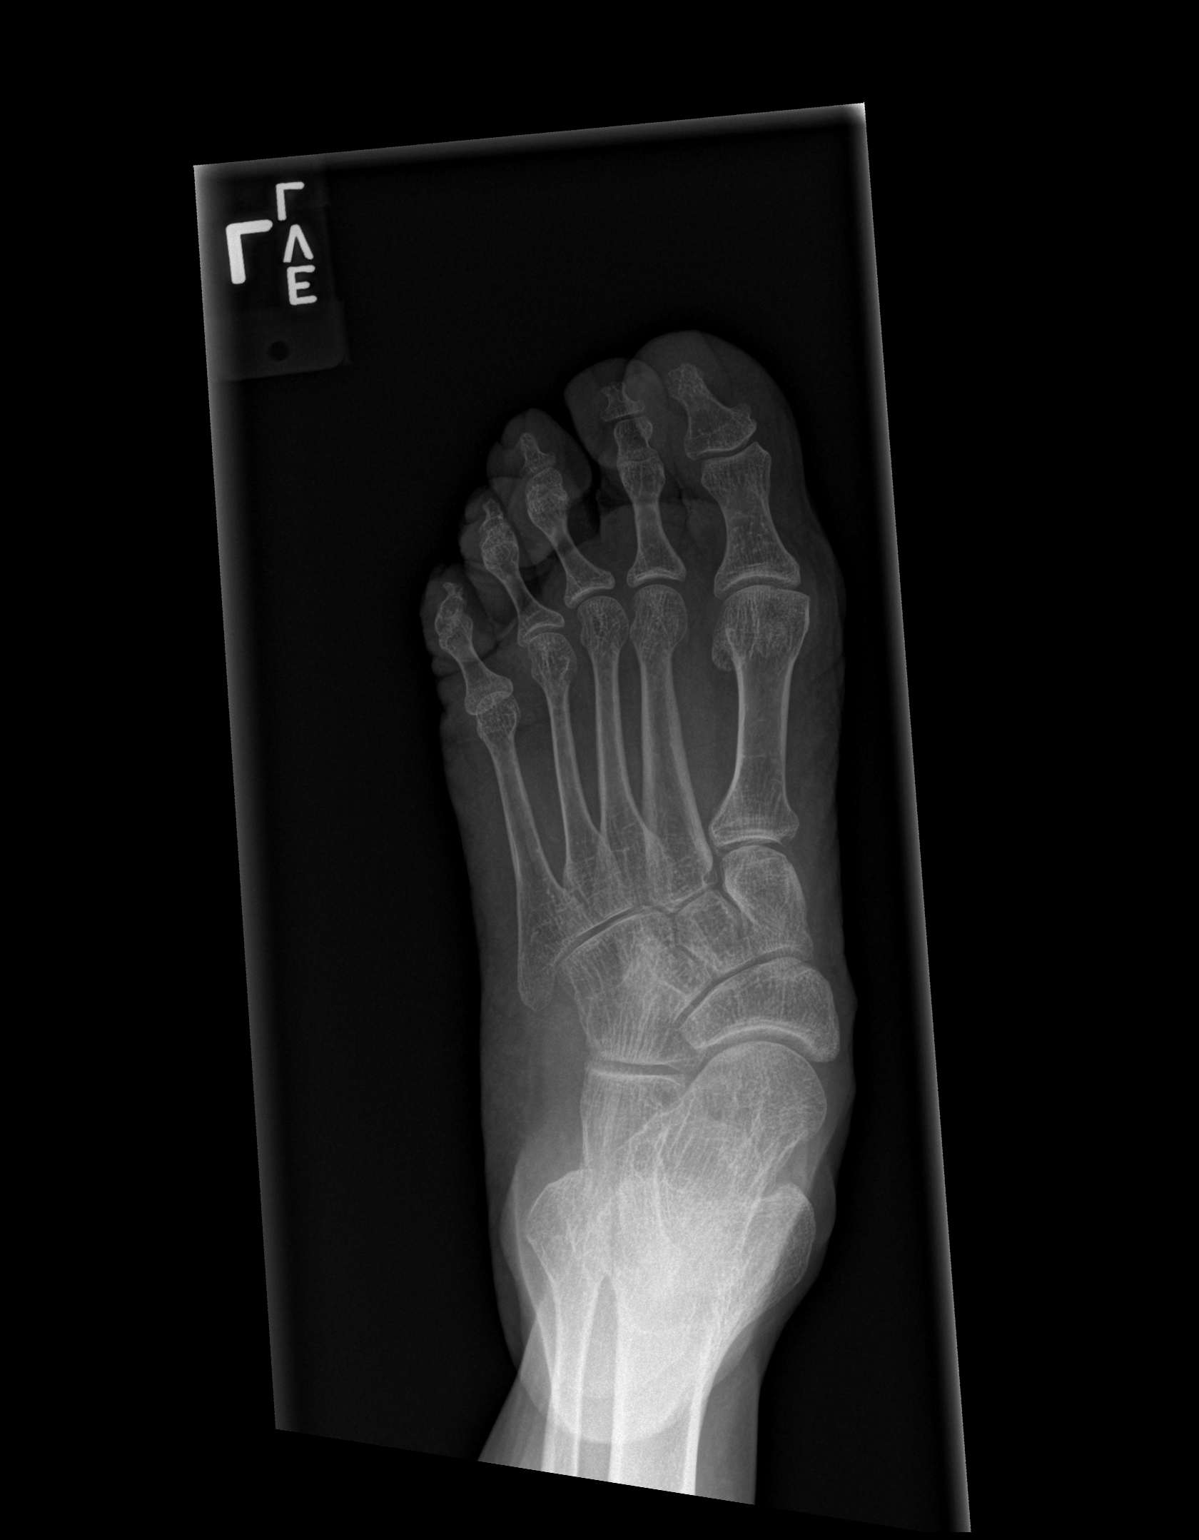

[x foot lat left]
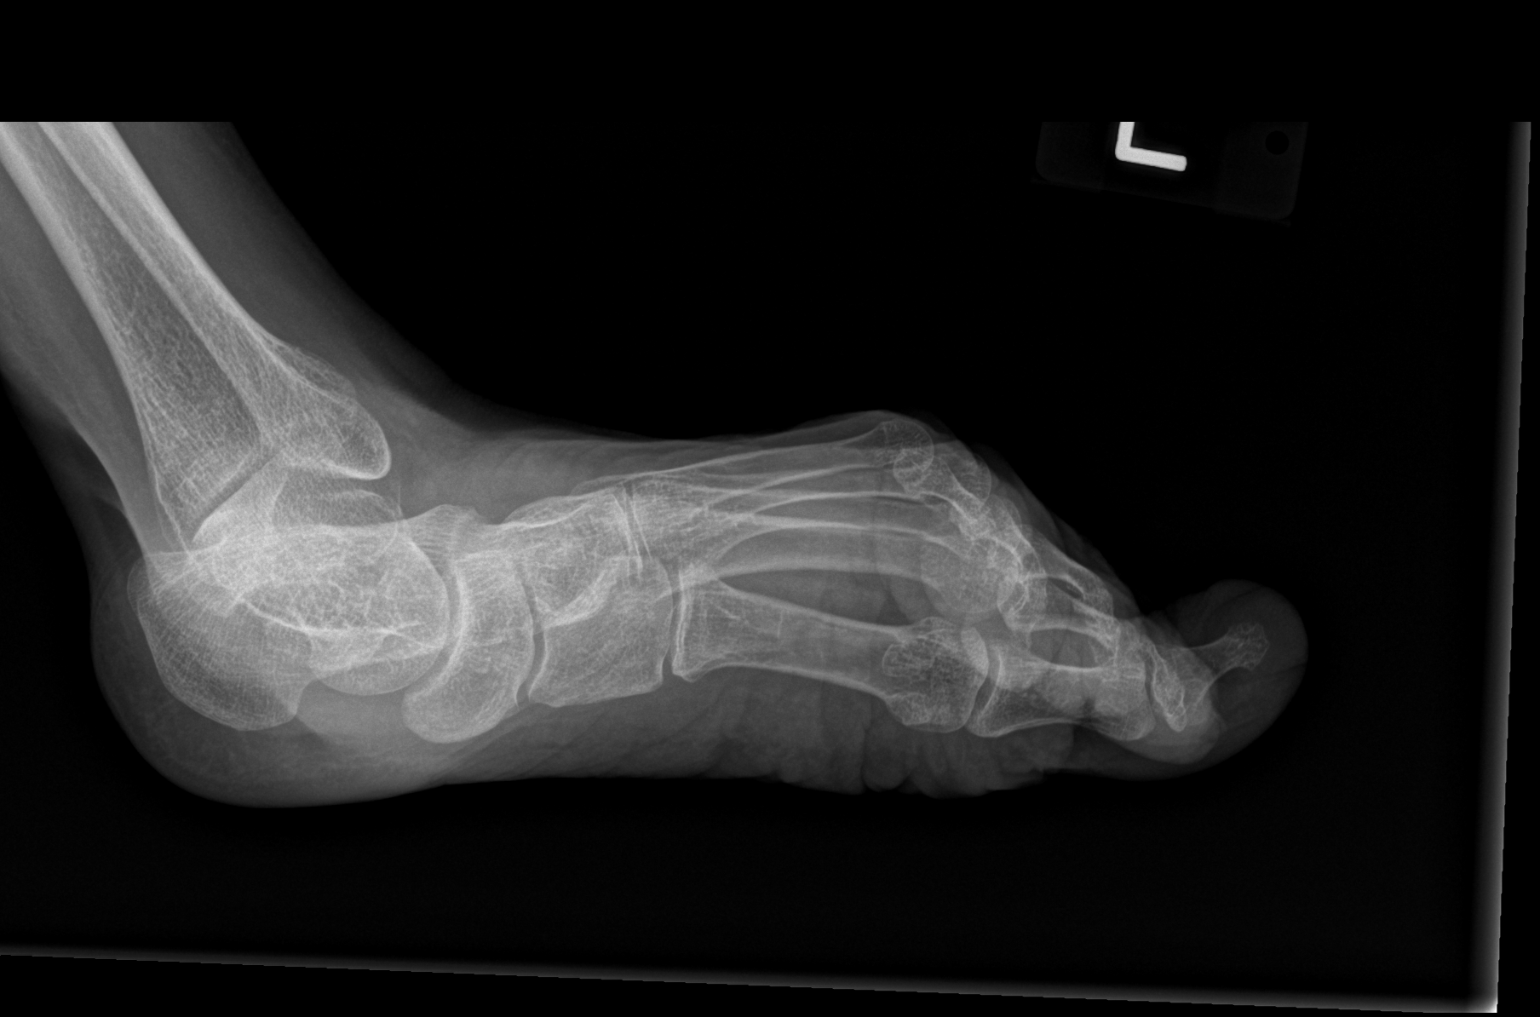

[3 of 3 positions shown; findings below may reference images not displayed]

FINDINGS: There is pes planus with hindfoot valgus. No acute fracture. Slight
splaying of the second and third toes. No soft tissue mass or
ulceration. On the lateral projection, the ankle appears to be
internally rotated with foot eversion. Toes are held in flexion at
the MCP articulations. Assessment of the hindfoot limited due to
overlap on the lateral view.
IMPRESSION: Osteopenia. Hindfoot valgus with foot eversion. No acute osseous
abnormality identified.

## 2018-05-04 IMAGING — CR DG HIP (WITH OR WITHOUT PELVIS) 2-3V*L*
3 series · 3 of 3 positions shown · non-contrast
Comparison: None.

CLINICAL DATA: Left hip pain

EXAM:
DG HIP (WITH OR WITHOUT PELVIS) 2-3V LEFT

[t pelvis ap]
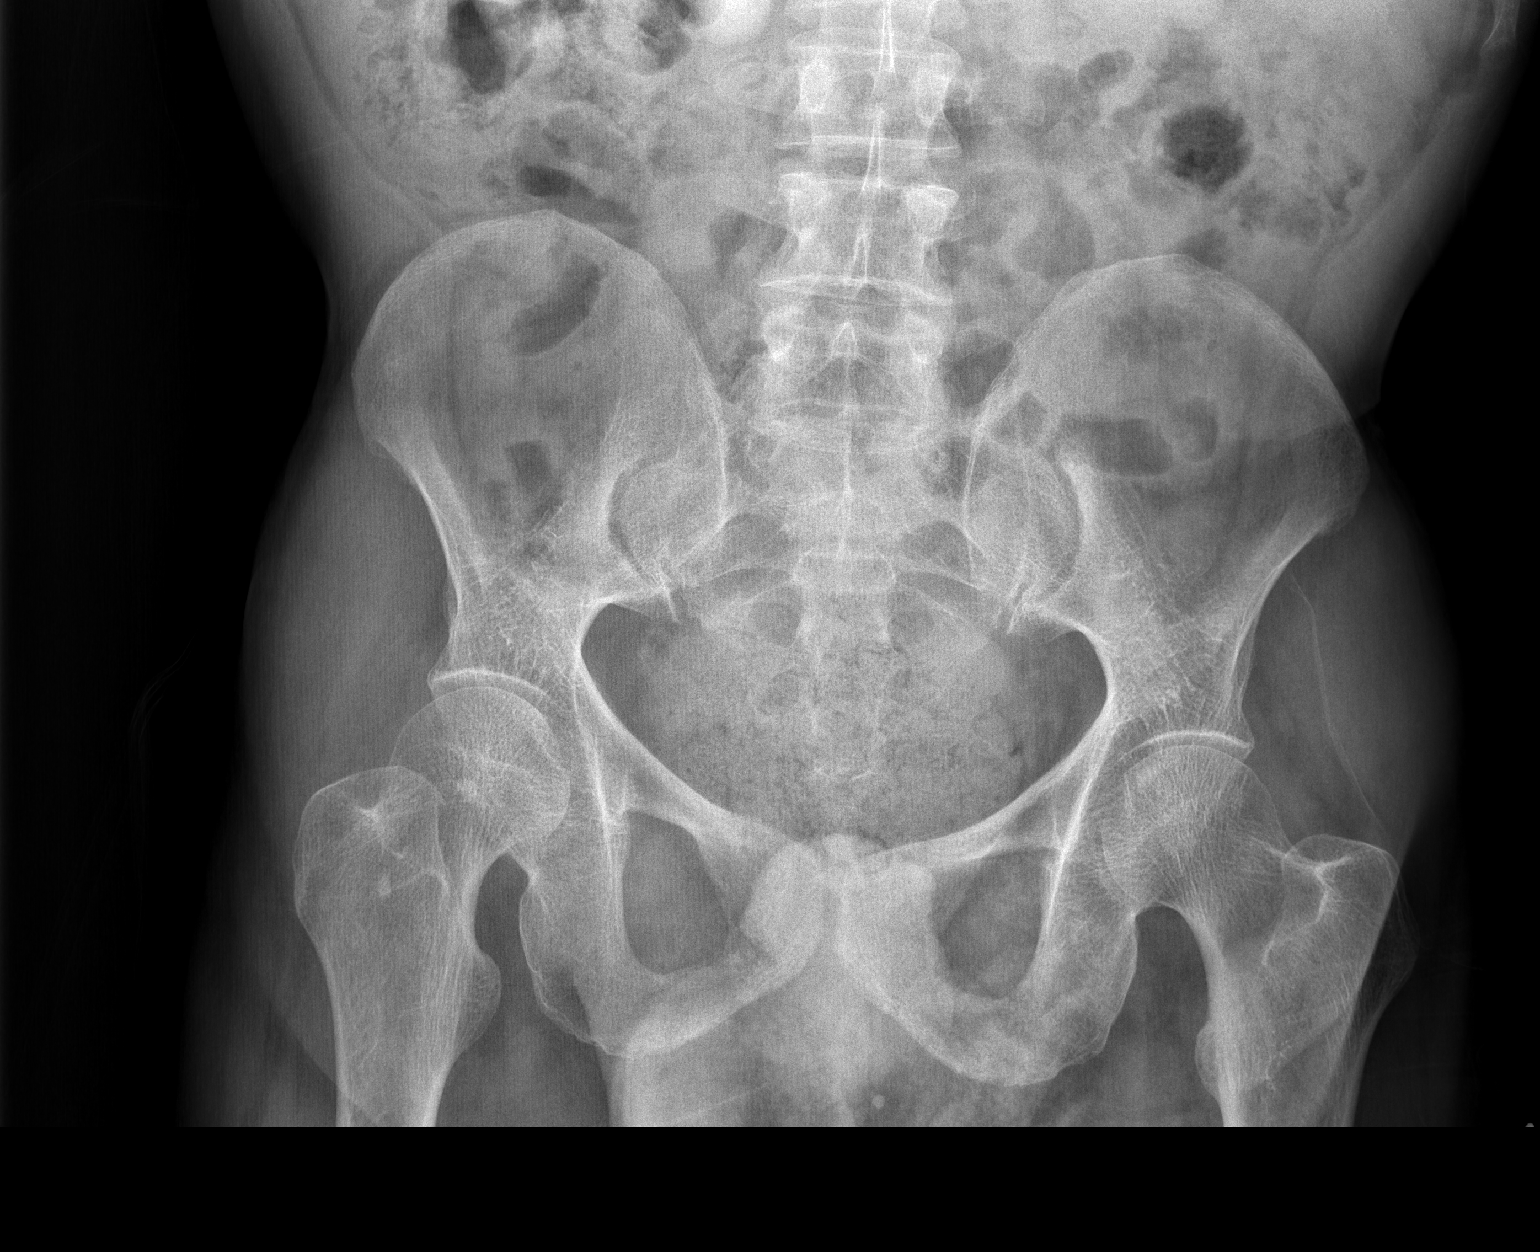

[t hip ap left]
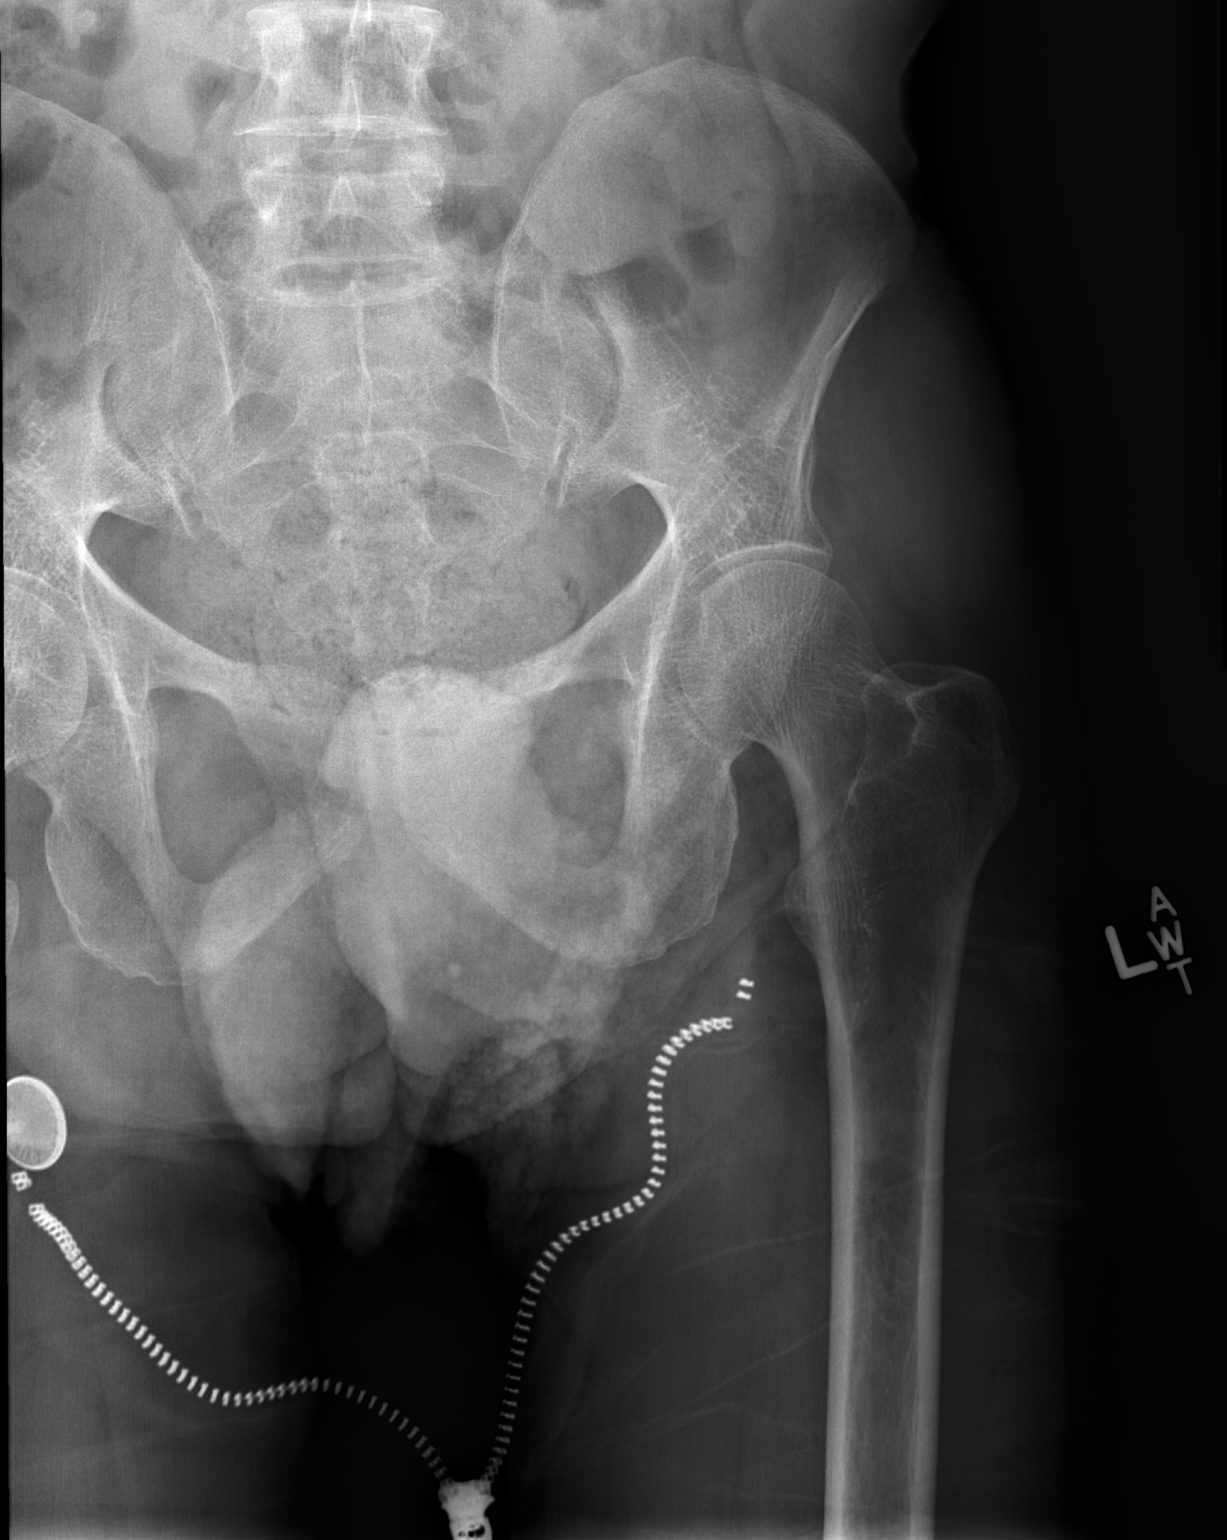

[t hip frog leg left]
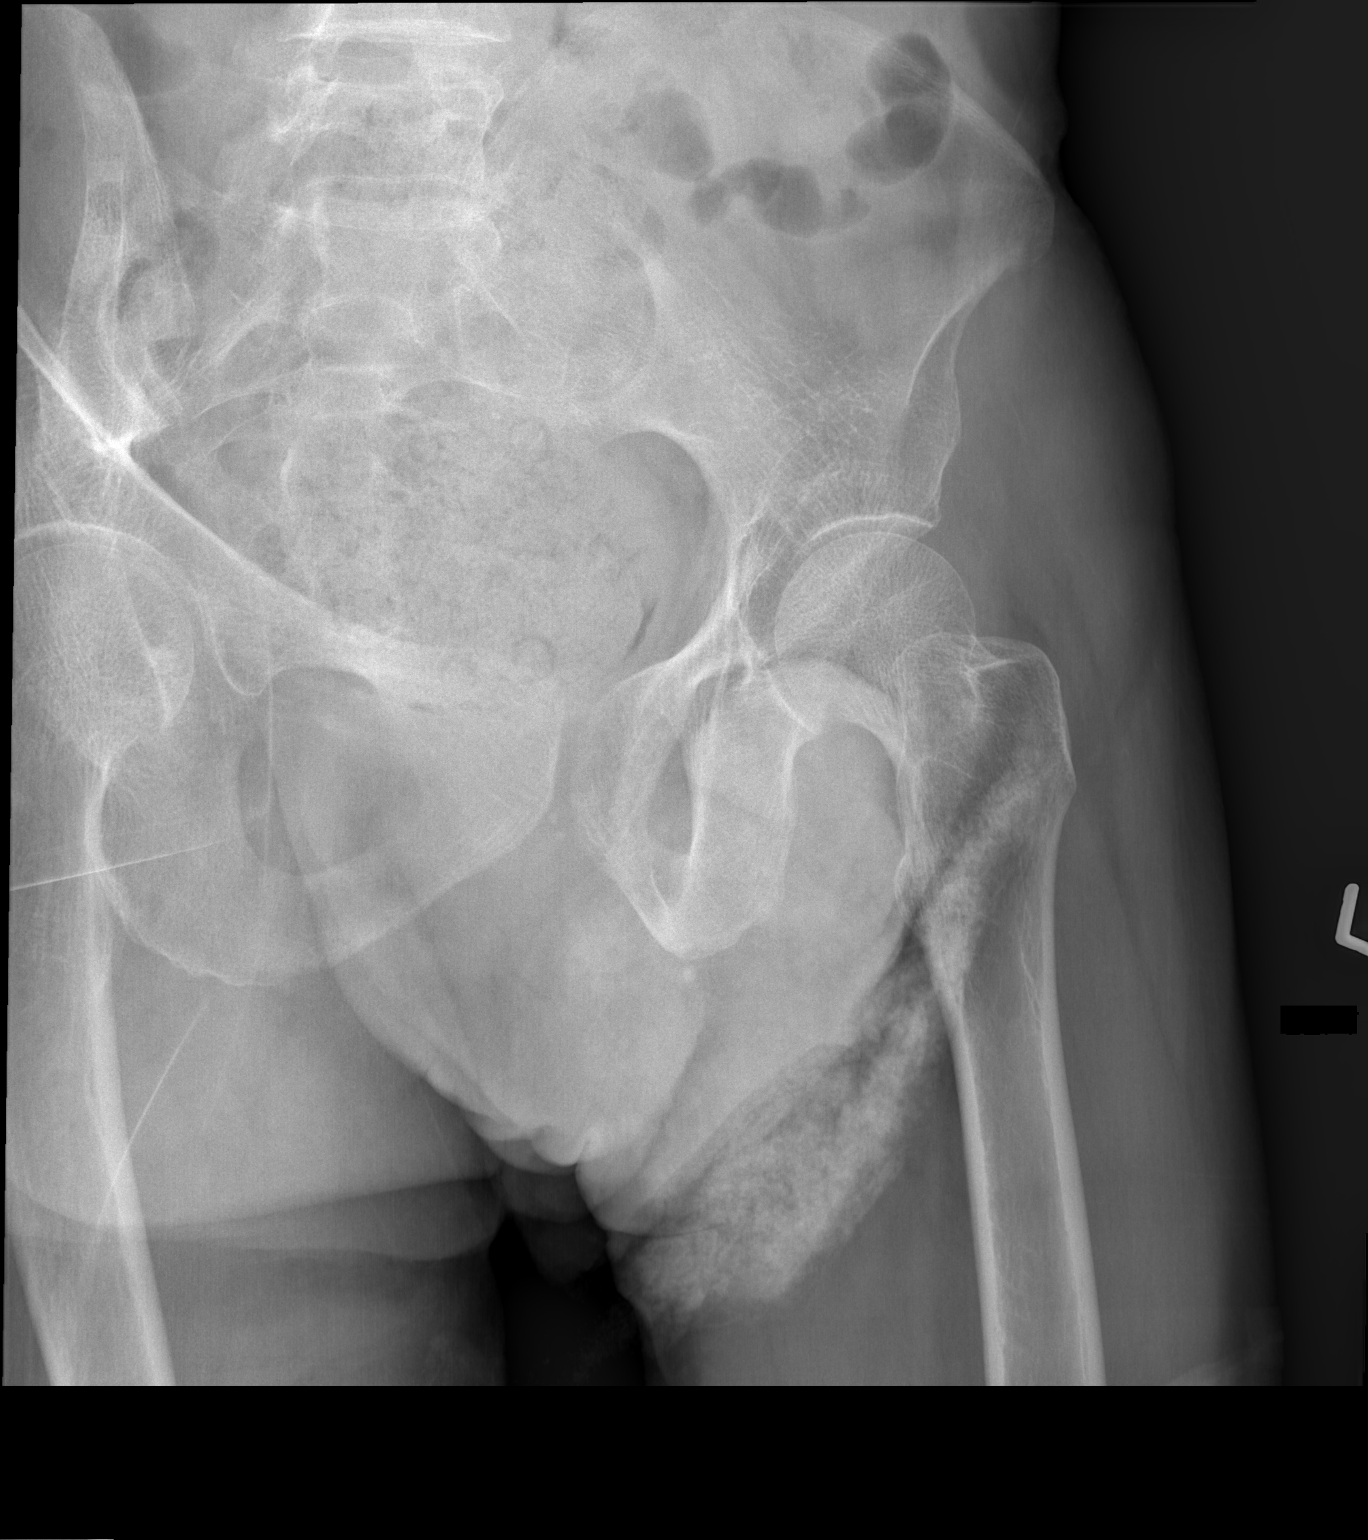

[3 of 3 positions shown; findings below may reference images not displayed]

FINDINGS: There is no evidence of hip fracture or dislocation. There is no
evidence of arthropathy or other focal bone abnormality.
IMPRESSION: Negative.
